# Patient Record
Sex: Female | Born: 1975 | Race: Black or African American | Hispanic: No | Marital: Single | Smoking: Never smoker
Health system: Southern US, Community
[De-identification: ages and names within clinical notes are randomized; demographics above are authoritative.]

## PROBLEM LIST (undated history)

## (undated) DIAGNOSIS — N189 Chronic kidney disease, unspecified: Secondary | ICD-10-CM

## (undated) DIAGNOSIS — I10 Essential (primary) hypertension: Secondary | ICD-10-CM

## (undated) DIAGNOSIS — R51 Headache: Secondary | ICD-10-CM

## (undated) DIAGNOSIS — O09529 Supervision of elderly multigravida, unspecified trimester: Secondary | ICD-10-CM

## (undated) DIAGNOSIS — D219 Benign neoplasm of connective and other soft tissue, unspecified: Secondary | ICD-10-CM

## (undated) HISTORY — DX: Benign neoplasm of connective and other soft tissue, unspecified: D21.9

## (undated) HISTORY — PX: WISDOM TOOTH EXTRACTION: SHX21

## (undated) HISTORY — PX: LEEP: SHX91

## (undated) HISTORY — DX: Chronic kidney disease, unspecified: N18.9

## (undated) HISTORY — DX: Supervision of elderly multigravida, unspecified trimester: O09.529

## (undated) HISTORY — PX: NO PAST SURGERIES: SHX2092

---

## 2003-09-05 ENCOUNTER — Other Ambulatory Visit: Admission: RE | Admit: 2003-09-05 | Discharge: 2003-09-05 | Payer: Self-pay | Admitting: Obstetrics and Gynecology

## 2004-01-02 ENCOUNTER — Inpatient Hospital Stay (HOSPITAL_COMMUNITY): Admission: AD | Admit: 2004-01-02 | Discharge: 2004-01-02 | Payer: Self-pay | Admitting: Obstetrics and Gynecology

## 2004-01-22 ENCOUNTER — Inpatient Hospital Stay (HOSPITAL_COMMUNITY): Admission: AD | Admit: 2004-01-22 | Discharge: 2004-01-22 | Payer: Self-pay | Admitting: Obstetrics and Gynecology

## 2004-02-16 ENCOUNTER — Inpatient Hospital Stay (HOSPITAL_COMMUNITY): Admission: AD | Admit: 2004-02-16 | Discharge: 2004-02-19 | Payer: Self-pay | Admitting: Obstetrics and Gynecology

## 2004-03-22 ENCOUNTER — Other Ambulatory Visit: Admission: RE | Admit: 2004-03-22 | Discharge: 2004-03-22 | Payer: Self-pay | Admitting: Obstetrics and Gynecology

## 2006-11-09 ENCOUNTER — Other Ambulatory Visit: Admission: RE | Admit: 2006-11-09 | Discharge: 2006-11-09 | Payer: Self-pay | Admitting: Family Medicine

## 2008-06-21 ENCOUNTER — Emergency Department (HOSPITAL_COMMUNITY): Admission: EM | Admit: 2008-06-21 | Discharge: 2008-06-21 | Payer: Self-pay | Admitting: Emergency Medicine

## 2008-08-14 ENCOUNTER — Other Ambulatory Visit: Admission: RE | Admit: 2008-08-14 | Discharge: 2008-08-14 | Payer: Self-pay | Admitting: Family Medicine

## 2009-02-10 ENCOUNTER — Other Ambulatory Visit: Admission: RE | Admit: 2009-02-10 | Discharge: 2009-02-10 | Payer: Self-pay | Admitting: Obstetrics and Gynecology

## 2010-01-08 ENCOUNTER — Encounter: Admission: RE | Admit: 2010-01-08 | Discharge: 2010-01-08 | Payer: Self-pay | Admitting: Family Medicine

## 2010-08-17 ENCOUNTER — Other Ambulatory Visit: Admission: RE | Admit: 2010-08-17 | Discharge: 2010-08-17 | Payer: Self-pay | Admitting: Family Medicine

## 2010-08-20 ENCOUNTER — Encounter: Admission: RE | Admit: 2010-08-20 | Discharge: 2010-08-20 | Payer: Self-pay | Admitting: Family Medicine

## 2011-04-15 NOTE — Consult Note (Signed)
NAME:  [name], CID                        ACCOUNT NO.:  [id number]   MEDICAL RECORD NO.:  [id number]                   PATIENT TYPE:  MAT   LOCATION:  [facility]                                 FACILITY:  [facility]   PHYSICIAN:  Lenoard Aden, M.D.             DATE OF BIRTH:  [birth year]   DATE OF CONSULTATION:  01/02/2004  DATE OF DISCHARGE:                                   CONSULTATION   CHIEF COMPLAINT:  Status post minor trauma.   HISTORY OF PRESENT ILLNESS:  The patient is a 35 year old African American  female, G2, P1, at 32+ weeks with a fall on the ice this morning.  She  reports some mild buttocks pain.  No gush of fluid and no bleeding.   PAST MEDICAL HISTORY:  Remarkable for:  1. Hypertension.  2. History of vaginal delivery.   ALLERGIES:  SHELLFISH.   MEDICATIONS:  Labetalol and prenatal vitamins.   FAMILY HISTORY:  Heart disease and hypertension.   PRENATAL LABORATORY DATA:  Blood type AB positive.  Rubella immune.  Hepatitis and HIV negative.   PHYSICAL EXAMINATION:  GENERAL APPEARANCE:  She is a well-developed, well-  nourished, African American female in no acute distress.  HEENT:  Normal.  LUNGS:  Clear.  HEART:  Regular rate and rhythm.  ABDOMEN:  Soft, gravid, and nontender.  PELVIC:  Cervical exam deferred.  EXTREMITIES:  No cords.  NEUROLOGIC:  Nonfocal.   IMPRESSION:  1. Minor trauma at 32 weeks.  2. Chronic hypertension, stable.   PLAN:  Discharge home.  Reassurance given.  Reactive NST noted.  No active  contractions appreciated.  The patient acknowledges and will follow up with  routine appointments as scheduled.                                               Lenoard Aden, M.D.    RJT/MEDQ  D:  01/02/2004  T:  01/02/2004  Job:  [id number]

## 2011-04-15 NOTE — H&P (Signed)
[name], [name]                   ACCOUNT NO.:  [id number]   MEDICAL RECORD NO.:  [id number]                   PATIENT TYPE:   LOCATION:                                       FACILITY:   PHYSICIAN:  Richardean Sale, M.D.                DATE OF BIRTH:  Oct 20, 1976   DATE OF ADMISSION:  02/16/2004  DATE OF DISCHARGE:                                HISTORY & PHYSICAL   ADMISSION DIAGNOSES:  39-week pregnancy with chronic hypertension.   HISTORY OF PRESENT ILLNESS:  This is a 35 year old gravida 2, para 1-0-0-1  black female with a history of hypertension who has been treated with  Labetalol throughout her pregnancy.  The patient presented today for a non-  stress test and was found to have a reactive non-stress test, however, her  blood pressure was elevated at 150/100.  Repeat was 150/102.  Blood  pressures in the office previously had been 130's-140's/80's.  The patient  has been on Labetalol 100 mg p.o. t.i.d.  She denies headaches, visual  changes or epigastric pain.  She reports good fetal movement.  Denies loss  of fluid or vaginal bleeding.  She does report occasional contractions.  A  baseline 24 hour urine and pre-eclampsia labs at 20 weeks were within normal  limits.   OBSTETRICAL HISTORY:  Her past obstetric history includes spontaneous  vaginal delivery X1, weight 7 pounds, 4 ounces. Hypertension developed in  the end of her pregnancy.  Gynecologic history:  Denies sexually transmitted  infections or abnormal Pap smears.   PAST MEDICAL HISTORY:  Chronic hypertension.   PAST SURGICAL HISTORY:  None.   FAMILY HISTORY:  No congenital abnormalities, chromosomal defects, trisomy,  spina bifida, sickle cell disease, cystic fibrosis or other congenital  problems.   SOCIAL HISTORY:  No tobacco, alcohol or drugs.  Patient is married.   PHYSICAL EXAMINATION:  VITAL SIGNS:  Blood pressure 150/100.  Weight 269  pounds.  GENERAL:  She is a well-developed,  well-nourished black female in no  apparent distress.  HEART:  Regular rate and rhythm.  LUNGS:  Clear to auscultation bilaterally.  ABDOMEN:  Gravid, soft, nontender.  Fundal height has been persistently 2 cm  greater than estimated gestational age.  PELVIC:  Cervix previously was 1 cm dilated, 2 cm long, minus 2 station,  vertex and ballottable.  EXTREMITIES:  Trace to 1+ edema bilaterally.  Nontender.  Deep tendon  reflexes 2+ bilaterally. No clonus.   LABORATORY DATA:  Prenatal labs showing blood type AB positive, antibody  screen negative, sickle cell trait negative, RPR nonreactive, rubella  immune, hepatitis B surface antigen nonreactive, human immunodeficiency  virus nonreactive, group B Beta Strep negative at 36 weeks.  One hour  Glucola within normal limits.  Her 28 week hemoglobin was 11.3.  GC and  Chlamydia negative.  Pap smear within normal limits.  Last ultrasound  performed on February 02, 2004 at [redacted] weeks gestation revealed an appropriately  growing fetus measuring 3,308 grams.  Amniotic fluid index was 12.9, vertex  presentation.   ASSESSMENT:  Patient is a 35 year old gravida 2, para 1-0-0-1 black female  at 55 weeks with chronic hypertension now with elevated blood pressure.   PLAN:  1. Admit to labor and delivery.  2. Check pre-eclampsia labs.  3. Continue with fetal monitoring.  4. Start low dose Pitocin, amniotomy when appropriate.  5. Stadol epidural PRN.  6. Serial blood pressures.  7. Expect spontaneous vaginal delivery.                                               Richardean Sale, M.D.    JW/MEDQ  D:  02/16/2004  T:  02/16/2004  Job:  [id number]

## 2012-09-16 ENCOUNTER — Encounter (HOSPITAL_COMMUNITY): Payer: Self-pay | Admitting: Emergency Medicine

## 2012-09-16 ENCOUNTER — Emergency Department (HOSPITAL_COMMUNITY)
Admission: EM | Admit: 2012-09-16 | Discharge: 2012-09-17 | Disposition: A | Discharge status: Home / Self Care | Payer: Managed Care, Other (non HMO) | Attending: Emergency Medicine | Admitting: Emergency Medicine

## 2012-09-16 ENCOUNTER — Emergency Department (HOSPITAL_COMMUNITY)
Admission: EM | Admit: 2012-09-16 | Discharge: 2012-09-16 | Disposition: A | Discharge status: Nursing Home (Includes ICF) | Payer: Self-pay | Source: Home / Self Care

## 2012-09-16 DIAGNOSIS — R040 Epistaxis: Secondary | ICD-10-CM | POA: Insufficient documentation

## 2012-09-16 DIAGNOSIS — R5381 Other malaise: Secondary | ICD-10-CM | POA: Insufficient documentation

## 2012-09-16 DIAGNOSIS — I10 Essential (primary) hypertension: Secondary | ICD-10-CM

## 2012-09-16 HISTORY — DX: Essential (primary) hypertension: I10

## 2012-09-16 MED ORDER — METOPROLOL TARTRATE 25 MG PO TABS
25.0000 mg | ORAL_TABLET | Freq: Once | ORAL | Status: AC
  Administered 2012-09-16: 25 mg via ORAL
  Filled 2012-09-16: qty 1

## 2012-09-16 MED ORDER — CLONIDINE HCL 0.1 MG PO TABS
0.2000 mg | ORAL_TABLET | Freq: Once | ORAL | Status: AC
  Administered 2012-09-16: 0.2 mg via ORAL
  Filled 2012-09-16: qty 2

## 2012-09-16 NOTE — ED Notes (Signed)
Dressing applied to nose. 

## 2012-09-16 NOTE — ED Provider Notes (Signed)
History     CSN: [id number]  Arrival date & time 09/16/12  1953   First MD Initiated Contact with Patient 09/16/12 2031      Chief Complaint  Patient presents with  . Epistaxis   HPI  History provided by the patient. Patient is a 36 year old female with history of difficult to control hypertension who presents with complaints of right nosebleed. Patient reports that bleeding began around 5 or 6:00. Patient was initially seen at urgent care Center and sent here for additional treatment. Patient states they were unable to stop the bleeding in urgent care Center. Patient also has had elevated blood pressures today. She reports difficulty controlling her blood pressure in the past but recently was put on a clonidine and additional blood pressure medications which seemed to improve her pressure. Patient denies any other symptoms currently. Denies any headache, dizziness, lightheadedness, chest pain, shortness of breath, abdominal pain or urinary complaints. Patient is not on any blood thinner medicines. She denies having similar nosebleeds in the past. She denies any injury or trauma to the nose.    Past Medical History  Diagnosis Date  . Hypertension     No past surgical history on file.  No family history on file.  History  Substance Use Topics  . Smoking status: Never Smoker   . Smokeless tobacco: Not on file  . Alcohol Use: No    OB History    Grav Para Term Preterm Abortions TAB SAB Ect Mult Living                  Review of Systems  Constitutional: Positive for fatigue. Negative for fever and chills.  HENT: Positive for nosebleeds. Negative for congestion, sore throat and rhinorrhea.   Respiratory: Negative for shortness of breath.   Cardiovascular: Negative for chest pain.  Gastrointestinal: Negative for nausea, vomiting and abdominal pain.  Neurological: Negative for dizziness, syncope, weakness, light-headedness, numbness and headaches.    Allergies  Review of  patient's allergies indicates no known allergies.  Home Medications   Current Outpatient Rx  Name Route Sig Dispense Refill  . CLONIDINE HCL 0.2 MG PO TABS Oral Take 0.2 mg by mouth 2 (two) times daily.    Marland Kitchen OLMESARTAN-AMLODIPINE-HCTZ 40-10-25 MG PO TABS Oral Take 1 tablet by mouth daily.      BP 194/125  Pulse 71  Temp 97.9 F (36.6 C) (Oral)  Resp 20  SpO2 98%  LMP 09/01/2012  Physical Exam  Nursing note and vitals reviewed. Constitutional: She is oriented to person, place, and time. She appears well-developed and well-nourished. No distress.  HENT:  Head: Normocephalic.  Mouth/Throat: Oropharynx is clear and moist.       Bleeding from the posterior right nostril.  Eyes: Pupils are equal, round, and reactive to light.  Neck: Normal range of motion. Neck supple.  Cardiovascular: Normal rate and regular rhythm.   No murmur heard. Pulmonary/Chest: Effort normal and breath sounds normal. No respiratory distress. She has no wheezes. She has no rales.  Abdominal: Soft.  Neurological: She is alert and oriented to person, place, and time.  Skin: Skin is warm and dry.  Psychiatric: She has a normal mood and affect. Her behavior is normal.    ED Course  EPISTAXIS MANAGEMENT Date/Time: 09/16/2012 11:30 PM Performed by: Angus Seller Authorized by: Angus Seller Consent: Verbal consent obtained. Risks and benefits: risks, benefits and alternatives were discussed Consent given by: patient Patient identity confirmed: verbally with patient Time out:  Immediately prior to procedure a "time out" was called to verify the correct patient, procedure, equipment, support staff and site/side marked as required. Treatment site: right posterior Repair method: nasal balloon Post-procedure assessment: bleeding decreased Treatment complexity: complex Patient tolerance: Patient tolerated the procedure well with no immediate complications.     Results for orders placed during the  hospital encounter of 09/16/12  POCT I-STAT, CHEM 8      Component Value Range   Sodium 139  135 - 145 mEq/L   Potassium 3.9  3.5 - 5.1 mEq/L   Chloride 102  96 - 112 mEq/L   BUN 30 (*) 6 - 23 mg/dL   Creatinine, Ser 1.61 (*) 0.50 - 1.10 mg/dL   Glucose, Bld 096 (*) 70 - 99 mg/dL   Calcium, Ion 0.45  4.09 - 1.23 mmol/L   TCO2 23  0 - 100 mmol/L   Hemoglobin 14.3  12.0 - 15.0 g/dL   HCT 81.1  91.4 - 78.2 %     1. Right-sided epistaxis   2. Hypertension       MDM  Patient seen and evaluated. Patient currently with packing in nose without any continued bleeding through the packing. Patient appears well in no acute distress. Patient is hypertensive.   Medications given for her hypertension. Patient having some improvement.  Blood pressure continues to improve. Pressure now 173/116. Still hypertensive but asymptomatic. Rhino Rocket was placed in right nostril. Patient currently having control bleeding. She does occasionally have some small amounts of blood in the back of the throat. At this time patient requesting to return home.      Angus Seller, [place] 09/17/12 [phone]

## 2012-09-16 NOTE — ED Notes (Signed)
Patient has had a nose bleed approx 6:00 p.m.   This is her first nose bleed.  Patient reports having a very bad headache, nausea last night.  Took excedrin migraine last night, woke today feeling great.  Reports feeling fine today, even when nose started bleeding today

## 2012-09-16 NOTE — ED Provider Notes (Signed)
History     CSN: [id number]  Arrival date & time 09/16/12  1847   None     Chief Complaint  Patient presents with  . Epistaxis    (Consider location/radiation/quality/duration/timing/severity/associated sxs/prior treatment) HPI Comments: 36 year old female with a history of hypertension presents with acute epistaxis it started approximately 1.5 hours ago. Bleeding has been persistent and moderate. She is vomiting up blood. Bleeding is primarily from the right nostril at this time she has had some bleeding from the left nostril. She denies headache chest pain or shortness of breath, however, last night she had a severe headache.   Past Medical History  Diagnosis Date  . Hypertension     History reviewed. No pertinent past surgical history.  No family history on file.  History  Substance Use Topics  . Smoking status: Never Smoker   . Smokeless tobacco: Not on file  . Alcohol Use: No    OB History    Grav Para Term Preterm Abortions TAB SAB Ect Mult Living                  Review of Systems  Constitutional: Negative for fever and activity change.  HENT: Positive for nosebleeds. Negative for congestion, sore throat, facial swelling, trouble swallowing and neck pain.   Respiratory: Negative.   Gastrointestinal: Positive for nausea and vomiting.  Neurological: Negative.     Allergies  Review of patient's allergies indicates not on file.  Home Medications   Current Outpatient Rx  Name Route Sig Dispense Refill  . CLONIDINE HCL PO Oral Take by mouth 2 (two) times daily.    Marya Landry PO Oral Take by mouth.      BP 231/156  Pulse 72  Temp 98.6 F (37 C) (Oral)  Resp 19  SpO2 96%  LMP 09/01/2012  Physical Exam  Constitutional: She is oriented to person, place, and time. She appears well-developed and well-nourished. She appears distressed.  HENT:       Continuous trickle of blood from the right nostril. There is a blood clot in the left nostril.  Eyes:  EOM are normal.  Neck: Neck supple.  Cardiovascular: Normal rate.   Pulmonary/Chest: Effort normal. No respiratory distress.  Musculoskeletal: Normal range of motion.  Neurological: She is alert and oriented to person, place, and time.  Skin: Skin is warm and dry. No erythema.    ED Course  EPISTAXIS MANAGEMENT Date/Time: 09/16/2012 7:27 PM Performed by: Phineas Real, Lorianne Malbrough Authorized by: Phineas Real, Lucina Betty Consent: Verbal consent obtained. Consent given by: patient Patient identity confirmed: verbally with patient Patient sedated: no Repair method: anterior pack Treatment complexity: simple Comments: Rhinorocket placed in R nostril.   (including critical care time)  Labs Reviewed - No data to display No results found.   1. Epistaxis   2. Hypertension, malignant       MDM  Rhino Rocket placed into the right nostril. She will be sent down to the emergency department via shuttle for epistaxis management and hypertension management.         Hayden Rasmussen, NP 09/16/12 1929  Hayden Rasmussen, NP 09/16/12 [phone]

## 2012-09-16 NOTE — ED Notes (Signed)
Pt has been bleeding since 1730

## 2012-09-16 NOTE — ED Notes (Addendum)
Pt states epistais since 16:00 this afternoon during a meeting. Pt denies HA, pain or pressure. Pt just states started bleeding. Pt went to urgent care and they inserted a rhinorytis into right nare. Pt bleeding around the rhino which is partially coming out. Pt alert and oriented. denies pain currently. Pt denies blood thinners, but does have elevated BP. Did take BP meds today.

## 2012-09-16 NOTE — ED Notes (Signed)
Family at bedside. 

## 2012-09-16 NOTE — ED Provider Notes (Signed)
Medical screening examination/treatment/procedure(s) were performed by non-physician practitioner and as supervising physician I was immediately available for consultation/collaboration.  Kameshia Madruga, M.D.   Daniel Johndrow C Shawntelle Ungar, MD 09/16/12 2143 

## 2012-09-17 LAB — POCT I-STAT, CHEM 8
Chloride: 102 mEq/L (ref 96–112)
Creatinine, Ser: 1.5 mg/dL — ABNORMAL HIGH (ref 0.50–1.10)
Glucose, Bld: 104 mg/dL — ABNORMAL HIGH (ref 70–99)
HCT: 42 % (ref 36.0–46.0)
Hemoglobin: 14.3 g/dL (ref 12.0–15.0)
Potassium: 3.9 mEq/L (ref 3.5–5.1)
Sodium: 139 mEq/L (ref 135–145)

## 2012-09-17 MED ORDER — LABETALOL HCL 5 MG/ML IV SOLN
20.0000 mg | Freq: Once | INTRAVENOUS | Status: AC
  Administered 2012-09-17: 20 mg via INTRAVENOUS
  Filled 2012-09-17: qty 4

## 2012-09-17 MED ORDER — SODIUM CHLORIDE 0.9 % IV BOLUS (SEPSIS)
1000.0000 mL | Freq: Once | INTRAVENOUS | Status: AC
  Administered 2012-09-17: 1000 mL via INTRAVENOUS

## 2012-09-17 NOTE — ED Provider Notes (Signed)
Medical screening examination/treatment/procedure(s) were performed by non-physician practitioner and as supervising physician I was immediately available for consultation/collaboration.  Izumi Mixon M Kathe Wirick, MD 09/17/12 0512 

## 2014-02-17 ENCOUNTER — Inpatient Hospital Stay (HOSPITAL_COMMUNITY)
Admission: EM | Admit: 2014-02-17 | Discharge: 2014-02-19 | DRG: 683 | Disposition: A | Discharge status: Home / Self Care | Payer: 59 | Attending: Internal Medicine | Admitting: Internal Medicine

## 2014-02-17 ENCOUNTER — Encounter (HOSPITAL_COMMUNITY): Payer: Self-pay | Admitting: Emergency Medicine

## 2014-02-17 DIAGNOSIS — Z8249 Family history of ischemic heart disease and other diseases of the circulatory system: Secondary | ICD-10-CM

## 2014-02-17 DIAGNOSIS — E860 Dehydration: Secondary | ICD-10-CM | POA: Diagnosis present

## 2014-02-17 DIAGNOSIS — N189 Chronic kidney disease, unspecified: Secondary | ICD-10-CM

## 2014-02-17 DIAGNOSIS — N183 Chronic kidney disease, stage 3 unspecified: Secondary | ICD-10-CM | POA: Diagnosis present

## 2014-02-17 DIAGNOSIS — Z833 Family history of diabetes mellitus: Secondary | ICD-10-CM

## 2014-02-17 DIAGNOSIS — N179 Acute kidney failure, unspecified: Secondary | ICD-10-CM | POA: Diagnosis present

## 2014-02-17 DIAGNOSIS — A088 Other specified intestinal infections: Secondary | ICD-10-CM | POA: Diagnosis present

## 2014-02-17 DIAGNOSIS — I10 Essential (primary) hypertension: Secondary | ICD-10-CM | POA: Diagnosis present

## 2014-02-17 DIAGNOSIS — G43909 Migraine, unspecified, not intractable, without status migrainosus: Secondary | ICD-10-CM | POA: Diagnosis present

## 2014-02-17 DIAGNOSIS — Z79899 Other long term (current) drug therapy: Secondary | ICD-10-CM

## 2014-02-17 DIAGNOSIS — K529 Noninfective gastroenteritis and colitis, unspecified: Secondary | ICD-10-CM

## 2014-02-17 DIAGNOSIS — I129 Hypertensive chronic kidney disease with stage 1 through stage 4 chronic kidney disease, or unspecified chronic kidney disease: Principal | ICD-10-CM | POA: Diagnosis present

## 2014-02-17 DIAGNOSIS — R9431 Abnormal electrocardiogram [ECG] [EKG]: Secondary | ICD-10-CM

## 2014-02-17 DIAGNOSIS — I160 Hypertensive urgency: Secondary | ICD-10-CM | POA: Diagnosis present

## 2014-02-17 DIAGNOSIS — J309 Allergic rhinitis, unspecified: Secondary | ICD-10-CM | POA: Diagnosis present

## 2014-02-17 DIAGNOSIS — R748 Abnormal levels of other serum enzymes: Secondary | ICD-10-CM

## 2014-02-17 HISTORY — DX: Headache: R51

## 2014-02-17 LAB — I-STAT TROPONIN, ED: TROPONIN I, POC: 0.04 ng/mL (ref 0.00–0.08)

## 2014-02-17 LAB — BASIC METABOLIC PANEL
BUN: 28 mg/dL — ABNORMAL HIGH (ref 6–23)
CALCIUM: 9.7 mg/dL (ref 8.4–10.5)
CHLORIDE: 98 meq/L (ref 96–112)
CO2: 26 meq/L (ref 19–32)
Creatinine, Ser: 2.28 mg/dL — ABNORMAL HIGH (ref 0.50–1.10)
GFR calc Af Amer: 30 mL/min — ABNORMAL LOW (ref >90)
GFR calc non Af Amer: 26 mL/min — ABNORMAL LOW (ref >90)
GLUCOSE: 104 mg/dL — AB (ref 70–99)
Potassium: 4.7 mEq/L (ref 3.7–5.3)
SODIUM: 136 meq/L — AB (ref 137–147)

## 2014-02-17 LAB — CBC WITH DIFFERENTIAL/PLATELET
Basophils Absolute: 0 10*3/uL (ref 0.0–0.1)
Basophils Relative: 1 % (ref 0–1)
Eosinophils Absolute: 0.2 10*3/uL (ref 0.0–0.7)
Eosinophils Relative: 3 % (ref 0–5)
HCT: 40 % (ref 36.0–46.0)
Hemoglobin: 13.8 g/dL (ref 12.0–15.0)
LYMPHS PCT: 33 % (ref 12–46)
Lymphs Abs: 2.2 10*3/uL (ref 0.7–4.0)
MCH: 29.2 pg (ref 26.0–34.0)
MCHC: 34.5 g/dL (ref 30.0–36.0)
MCV: 84.6 fL (ref 78.0–100.0)
Monocytes Absolute: 0.6 10*3/uL (ref 0.1–1.0)
Monocytes Relative: 8 % (ref 3–12)
Neutro Abs: 3.7 10*3/uL (ref 1.7–7.7)
Neutrophils Relative %: 56 % (ref 43–77)
PLATELETS: 227 10*3/uL (ref 150–400)
RBC: 4.73 MIL/uL (ref 3.87–5.11)
RDW: 14.1 % (ref 11.5–15.5)
WBC: 6.8 10*3/uL (ref 4.0–10.5)

## 2014-02-17 LAB — COMPREHENSIVE METABOLIC PANEL
ALT: 17 U/L (ref 0–35)
AST: 13 U/L (ref 0–37)
Albumin: 3.8 g/dL (ref 3.5–5.2)
Alkaline Phosphatase: 59 U/L (ref 39–117)
BILIRUBIN TOTAL: 0.6 mg/dL (ref 0.3–1.2)
BUN: 30 mg/dL — ABNORMAL HIGH (ref 6–23)
CO2: 26 meq/L (ref 19–32)
CREATININE: 2.43 mg/dL — AB (ref 0.50–1.10)
Calcium: 10.2 mg/dL (ref 8.4–10.5)
Chloride: 98 mEq/L (ref 96–112)
GFR calc non Af Amer: 24 mL/min — ABNORMAL LOW (ref >90)
GFR, EST AFRICAN AMERICAN: 28 mL/min — AB (ref >90)
GLUCOSE: 95 mg/dL (ref 70–99)
Potassium: 4.3 mEq/L (ref 3.7–5.3)
Sodium: 137 mEq/L (ref 137–147)
Total Protein: 7.5 g/dL (ref 6.0–8.3)

## 2014-02-17 LAB — CBC
HCT: 38.7 % (ref 36.0–46.0)
Hemoglobin: 13.4 g/dL (ref 12.0–15.0)
MCH: 29.5 pg (ref 26.0–34.0)
MCHC: 34.6 g/dL (ref 30.0–36.0)
MCV: 85.1 fL (ref 78.0–100.0)
PLATELETS: 203 10*3/uL (ref 150–400)
RBC: 4.55 MIL/uL (ref 3.87–5.11)
RDW: 14.2 % (ref 11.5–15.5)
WBC: 6 10*3/uL (ref 4.0–10.5)

## 2014-02-17 LAB — RAPID URINE DRUG SCREEN, HOSP PERFORMED
AMPHETAMINES: NOT DETECTED
Barbiturates: POSITIVE — AB
Benzodiazepines: NOT DETECTED
COCAINE: NOT DETECTED
OPIATES: NOT DETECTED
TETRAHYDROCANNABINOL: NOT DETECTED

## 2014-02-17 LAB — PROTIME-INR
INR: 1 (ref 0.00–1.49)
Prothrombin Time: 13 seconds (ref 11.6–15.2)

## 2014-02-17 LAB — I-STAT CHEM 8, ED
BUN: 27 mg/dL — ABNORMAL HIGH (ref 6–23)
CALCIUM ION: 1.22 mmol/L (ref 1.12–1.23)
CHLORIDE: 100 meq/L (ref 96–112)
Creatinine, Ser: 2.5 mg/dL — ABNORMAL HIGH (ref 0.50–1.10)
Glucose, Bld: 100 mg/dL — ABNORMAL HIGH (ref 70–99)
HEMATOCRIT: 41 % (ref 36.0–46.0)
Hemoglobin: 13.9 g/dL (ref 12.0–15.0)
Potassium: 4.7 mEq/L (ref 3.7–5.3)
Sodium: 137 mEq/L (ref 137–147)
TCO2: 25 mmol/L (ref 0–100)

## 2014-02-17 LAB — APTT: APTT: 27 s (ref 24–37)

## 2014-02-17 LAB — PHOSPHORUS: PHOSPHORUS: 4.1 mg/dL (ref 2.3–4.6)

## 2014-02-17 LAB — MAGNESIUM: Magnesium: 2.1 mg/dL (ref 1.5–2.5)

## 2014-02-17 MED ORDER — ASPIRIN-ACETAMINOPHEN-CAFFEINE 250-250-65 MG PO TABS
2.0000 | ORAL_TABLET | Freq: Four times a day (QID) | ORAL | Status: DC | PRN
  Administered 2014-02-18 – 2014-02-19 (×4): 2 via ORAL
  Filled 2014-02-17 (×3): qty 2

## 2014-02-17 MED ORDER — CLONIDINE HCL 0.2 MG PO TABS
0.2000 mg | ORAL_TABLET | Freq: Two times a day (BID) | ORAL | Status: DC
  Administered 2014-02-17 – 2014-02-18 (×3): 0.2 mg via ORAL
  Filled 2014-02-17 (×5): qty 1

## 2014-02-17 MED ORDER — SODIUM CHLORIDE 0.9 % IV SOLN
INTRAVENOUS | Status: DC
  Administered 2014-02-17: 22:00:00 via INTRAVENOUS

## 2014-02-17 MED ORDER — ACETAMINOPHEN 650 MG RE SUPP: 650.0000 mg | Freq: Four times a day (QID) | RECTAL | Status: DC | PRN

## 2014-02-17 MED ORDER — HYDRALAZINE HCL 20 MG/ML IJ SOLN
10.0000 mg | Freq: Once | INTRAMUSCULAR | Status: AC
  Administered 2014-02-17: 10 mg via INTRAVENOUS
  Filled 2014-02-17: qty 0.5

## 2014-02-17 MED ORDER — HYDROCODONE-ACETAMINOPHEN 5-325 MG PO TABS
1.0000 | ORAL_TABLET | ORAL | Status: DC | PRN
  Administered 2014-02-18: 2 via ORAL
  Filled 2014-02-17: qty 2

## 2014-02-17 MED ORDER — ONDANSETRON HCL 4 MG PO TABS
4.0000 mg | ORAL_TABLET | Freq: Four times a day (QID) | ORAL | Status: DC | PRN
  Administered 2014-02-19: 4 mg via ORAL
  Filled 2014-02-17: qty 1

## 2014-02-17 MED ORDER — ACETAMINOPHEN 325 MG PO TABS: 650.0000 mg | ORAL_TABLET | Freq: Four times a day (QID) | ORAL | Status: DC | PRN

## 2014-02-17 MED ORDER — HYDROCHLOROTHIAZIDE 25 MG PO TABS
25.0000 mg | ORAL_TABLET | Freq: Once | ORAL | Status: AC
Start: 2014-02-17 — End: 2014-02-17
  Administered 2014-02-17: 25 mg via ORAL
  Filled 2014-02-17: qty 1

## 2014-02-17 MED ORDER — ONDANSETRON HCL 4 MG/2ML IJ SOLN
4.0000 mg | Freq: Four times a day (QID) | INTRAMUSCULAR | Status: DC | PRN
  Administered 2014-02-18: 4 mg via INTRAVENOUS

## 2014-02-17 MED ORDER — HYDRALAZINE HCL 20 MG/ML IJ SOLN
10.0000 mg | Freq: Four times a day (QID) | INTRAMUSCULAR | Status: DC
Start: 2014-02-17 — End: 2014-02-18
  Administered 2014-02-17 – 2014-02-18 (×3): 10 mg via INTRAVENOUS
  Filled 2014-02-17 (×7): qty 0.5

## 2014-02-17 MED ORDER — AMLODIPINE BESYLATE 10 MG PO TABS
10.0000 mg | ORAL_TABLET | Freq: Once | ORAL | Status: AC
  Administered 2014-02-17: 10 mg via ORAL
  Filled 2014-02-17: qty 1

## 2014-02-17 MED ORDER — ONDANSETRON HCL 4 MG/2ML IJ SOLN
4.0000 mg | Freq: Three times a day (TID) | INTRAMUSCULAR | Status: AC | PRN
  Filled 2014-02-17: qty 2

## 2014-02-17 MED ORDER — OXYMETAZOLINE HCL 0.05 % NA SOLN: 1.0000 | Freq: Every day | NASAL | Status: DC | PRN

## 2014-02-17 MED ORDER — IRBESARTAN 300 MG PO TABS
300.0000 mg | ORAL_TABLET | Freq: Every day | ORAL | Status: DC
  Administered 2014-02-17: 300 mg via ORAL
  Filled 2014-02-17: qty 1

## 2014-02-17 MED ORDER — ADULT MULTIVITAMIN W/MINERALS CH
1.0000 | ORAL_TABLET | Freq: Every day | ORAL | Status: DC
  Administered 2014-02-17 – 2014-02-19 (×3): 1 via ORAL
  Filled 2014-02-17 (×3): qty 1

## 2014-02-17 NOTE — ED Provider Notes (Signed)
Medical screening examination/treatment/procedure(s) were performed by non-physician practitioner and as supervising physician I was immediately available for consultation/collaboration.   EKG Interpretation   Date/Time:  Monday February 17 2014 12:47:01 EDT Ventricular Rate:  53 PR Interval:  157 QRS Duration: 94 QT Interval:  487 QTC Calculation: 457 R Axis:   8 Text Interpretation:  Sinus rhythm Probable left atrial enlargement  Abnormal T, consider ischemia, lateral leads Confirmed by Alvino Chapel  MD,  Marcheta Horsey [phone]) on 02/17/2014 2:02:24 PM       Jasper Riling. Alvino Chapel, MD 02/17/14 [phone]

## 2014-02-17 NOTE — ED Provider Notes (Signed)
CSN: [id number]     Arrival date & time 02/17/14  1212 History   First MD Initiated Contact with Patient 02/17/14 1338     Chief Complaint  Patient presents with  . Hypertension     (Consider location/radiation/quality/duration/timing/severity/associated sxs/prior Treatment) HPI [name] is a 38 y.o. female who presents to emergency department complaining of elevated blood pressure. Patient states she has had sinus congestion and sinus pressure for several days. Also has had nausea, vomiting, diarrhea for several days. She went to her primary care Dr., who gave her a "pain shot" and two clonidine tablets which patient normally takes every morning, and she was sent here to emergency department for further evaluation of her elevated blood pressure. Her blood pressure at her doctor's office was 204/70. She states she has been taking multiple over-the-counter medications recently for her sinus congestion, which she believes elevated her blood pressure. She states she has had elevated blood pressure for about 3 days now. She denies any headache, chest pain, shortness of breath. No swelling in extremities. She states her primary care doctor did prescribe her medication for sinus infection, but told that she may need to stop the emergency department to make sure everything else was okay. She currently denies any symptoms. She states that after getting a pain shot her pain completely disappeared. Patient admits to not taking any of her blood pressure medicines this morning because she has been nauseated and vomiting. Patient denies feeling nauseated at this time. She states she has not vomited today.  Past Medical History  Diagnosis Date  . Hypertension    History reviewed. No pertinent past surgical history. No family history on file. History  Substance Use Topics  . Smoking status: Never Smoker   . Smokeless tobacco: Not on file  . Alcohol Use: No   OB History   Grav Para Term Preterm  Abortions TAB SAB Ect Mult Living                 Review of Systems  Constitutional: Negative for fever and chills.  HENT: Positive for congestion and sinus pressure.   Respiratory: Negative for cough, chest tightness and shortness of breath.   Cardiovascular: Negative for chest pain, palpitations and leg swelling.  Gastrointestinal: Positive for nausea, vomiting and diarrhea. Negative for abdominal pain.  Genitourinary: Negative for dysuria and flank pain.  Musculoskeletal: Negative for arthralgias, myalgias, neck pain and neck stiffness.  Skin: Negative for rash.  Neurological: Negative for dizziness, weakness, light-headedness and headaches.  All other systems reviewed and are negative.      Allergies  Review of patient's allergies indicates no known allergies.  Home Medications   Current Outpatient Rx  Name  Route  Sig  Dispense  Refill  . aspirin-acetaminophen-caffeine (EXCEDRIN MIGRAINE) 250-250-65 MG per tablet   Oral   Take 2 tablets by mouth every 6 (six) hours as needed for headache.         . cloNIDine (CATAPRES) 0.2 MG tablet   Oral   Take 0.2 mg by mouth 2 (two) times daily.         . Multiple Vitamin (MULTIVITAMIN WITH MINERALS) TABS tablet   Oral   Take 1 tablet by mouth daily.         . Olmesartan-Amlodipine-HCTZ (TRIBENZOR) 40-10-25 MG TABS   Oral   Take 1 tablet by mouth daily.         Marland Kitchen oxymetazoline (AFRIN) 0.05 % nasal spray   Each Nare  Place 1 spray into both nostrils daily as needed for congestion.          BP 173/133  Pulse 58  Temp(Src) 97.6 F (36.4 C) (Oral)  Resp 18  SpO2 100%  LMP 01/26/2014 Physical Exam  Nursing note and vitals reviewed. Constitutional: She is oriented to person, place, and time. She appears well-developed and well-nourished. No distress.  HENT:  Head: Normocephalic and atraumatic.  Eyes: Conjunctivae and EOM are normal. Pupils are equal, round, and reactive to light.  Neck: Neck supple.   Cardiovascular: Normal rate, regular rhythm and normal heart sounds.   Pulmonary/Chest: Effort normal and breath sounds normal. No respiratory distress. She has no wheezes. She has no rales.  Abdominal: Soft. Bowel sounds are normal. She exhibits no distension. There is no tenderness. There is no rebound.  Musculoskeletal: She exhibits no edema.  Neurological: She is alert and oriented to person, place, and time. No cranial nerve deficit. Coordination normal.  Skin: Skin is warm and dry.  Psychiatric: She has a normal mood and affect. Her behavior is normal.    ED Course  Procedures (including critical care time) Labs Review Labs Reviewed  BASIC METABOLIC PANEL - Abnormal; Notable for the following:    Sodium 136 (*)    Glucose, Bld 104 (*)    BUN 28 (*)    Creatinine, Ser 2.28 (*)    GFR calc non Af Amer 26 (*)    GFR calc Af Amer 30 (*)    All other components within normal limits  I-STAT CHEM 8, ED - Abnormal; Notable for the following:    BUN 27 (*)    Creatinine, Ser 2.50 (*)    Glucose, Bld 100 (*)    All other components within normal limits  CBC  I-STAT TROPOININ, ED   Imaging Review No results found.   EKG Interpretation   Date/Time:  Monday February 17 2014 12:47:01 EDT Ventricular Rate:  53 PR Interval:  157 QRS Duration: 94 QT Interval:  487 QTC Calculation: 457 R Axis:   8 Text Interpretation:  Sinus rhythm Probable left atrial enlargement  Abnormal T, consider ischemia, lateral leads Confirmed by Alvino Chapel  MD,  Ovid Curd [phone]) on 02/17/2014 2:02:24 PM      MDM   Final diagnoses:  Hypertensive urgency  Elevated creatine kinase  Abnormal ECG    Pt with elevated blood pressure, was sent here for further evaluation. Her BP initially here 173/133. She admits to not taking her medications.   2:40 PM ECG shows inverted T waves in lateral leads. No old ECG in the system. I spoke with [place] office. They do not have an old ECG on file there.  Also spoke with [facility] at [place], no old ECG there. Pt's Creatinine elevated  As well compared to last in our system. In setting of pt's elevated BP, abnormal ECG, elevated creatinine, will need inpatient admission for hypertensive emergency.   I have treated pt in ED with her regular tribenzor at this time, and she just receved 0.2mg  of clonidine at her PCP's office. Her blood pressure now is 170s/110s. At this time I think BP needs to be lowers slowly. Pt is currently asymptomatic. Spoke with [facility], will admit.    Filed Vitals:   02/17/14 1400 02/17/14 1430 02/17/14 1446 02/17/14 1500  BP: 171/131 194/142 194/142 173/117  Pulse: 56 47 60 59  Temp:   97.4 F (36.3 C)   TempSrc:   Oral  Resp: 10 15 19 13   SpO2: 100% 96% 100% 98%     Renold Genta, PA-C 02/17/14 1533

## 2014-02-17 NOTE — Progress Notes (Signed)
Received report on patient from ED.  Patient BP still elevated at 173/117.  ED RN to stabilize BP prior to transferring patient to floor.  Await callback from nurse.

## 2014-02-17 NOTE — Progress Notes (Addendum)
Patient received to floor with BP of 157/105.  Dr. Charlies Silvers paged, orders for schedule hydralazine and norvasc received.  Will administer and monitor BP.

## 2014-02-17 NOTE — Progress Notes (Signed)
Patient reports hx of stomach bug for the past week and last episode of diarrhea and vomiting this AM.  Patient has been placed on enteric precautions per protocol for C. Diff/enterovirus r/o.  Dr. Charlies Silvers notified. Have educated patient and family regarding infection prevention precautions.  Will continue to monitor.

## 2014-02-17 NOTE — ED Notes (Addendum)
Gave report to nurse on floor, nurse stated that pt BP was unstable and wanted to know if IV BP medication could be given to lower BP. Called Dr. Charlies Silvers for order and to make her aware.

## 2014-02-17 NOTE — H&P (Addendum)
[facility] History and Physical  [name] FGH:[id number] DOB: 1976/09/22 DOA: 02/17/2014  Referring physician: ER physician PCP: Rachell Cipro, MD   Chief Complaint: high blood pressure  HPI:  38 year old female with past medical history of hypertension who presented to [facility] ED from PCP office due to high blood pressure, specifically 204/70. Pt reported she had ongoing nausea, vomiting and diarrhea for past week she reported she had stomach flu. She took her blood pressure meds but she had vomiting so she stopped taking blood pressure meds. No reports of headaches or blurry vision. No reports of chest pain, shortness of breath or palpitations. No reports of fevers. No blood in stool or urine.  In ED, BP was 194/142 adn with clonidine, avapro, hctz and Norvasc blood pressure went down to 157/105. Her blood work was significant for an acute elevation in creatinine, 2.38. Pt is not aware of any kidney problems in past.   Assessment and Plan:  Principal Problem:   Hypertensive urgency - restarted norvasc, clonidine and held avapro and hctz due to renal insufficiency - added hydralazine 10 mg IV every 6 hours for better BP control Active Problems:   Acute renal failure - likely due to Avapro/Hctz which is not on hold - may use IV fluids for next 12 hours and then reassess if IV fluids needed - recheck renal function in am   Nausea/vomiting/diarrhea - likely viral gastroenteritis - check viral GI pathogen panel, stool for C.diff, stool culture  Radiological Exams on Admission: No results found.   Code Status: Full Family Communication: Pt at bedside Disposition Plan: Admit for further evaluation  Leisa Lenz, MD  [facility] Pager [phone]  Review of Systems:  Constitutional: Negative for fever, chills and malaise/fatigue. Negative for diaphoresis.  HENT: Negative for hearing loss, ear pain, nosebleeds, congestion, sore throat, neck pain, tinnitus and ear  discharge.   Eyes: Negative for blurred vision, double vision, photophobia, pain, discharge and redness.  Respiratory: Negative for cough, hemoptysis, sputum production, shortness of breath, wheezing and stridor.   Cardiovascular: Negative for chest pain, palpitations, orthopnea, claudication and leg swelling.  Gastrointestinal: Negative for nausea, vomiting and abdominal pain. Negative for heartburn, constipation, blood in stool and melena.  Genitourinary: Negative for dysuria, urgency, frequency, hematuria and flank pain.  Musculoskeletal: Negative for myalgias, back pain, joint pain and falls.  Skin: Negative for itching and rash.  Neurological: Negative for dizziness and weakness. Negative for tingling, tremors, sensory change, speech change, focal weakness, loss of consciousness and headaches.  Endo/Heme/Allergies: Negative for environmental allergies and polydipsia. Does not bruise/bleed easily.  Psychiatric/Behavioral: Negative for suicidal ideas. The patient is not nervous/anxious.      Past Medical History  Diagnosis Date  . Hypertension   . OFBPZWCH(852.7)    Past Surgical History  Procedure Laterality Date  . No past surgeries     Social History:  reports that she has never smoked. She has never used smokeless tobacco. She reports that she does not drink alcohol or use illicit drugs.  No Known Allergies  Family History: hypertension in mother and father, diabetes in mother  Prior to Admission medications   Medication Sig Start Date End Date Taking? Authorizing Provider  aspirin-acetaminophen-caffeine (EXCEDRIN MIGRAINE) [phone] MG per tablet Take 2 tablets by mouth every 6 (six) hours as needed for headache.   Yes Historical Provider, MD  cloNIDine (CATAPRES) 0.2 MG tablet Take 0.2 mg by mouth 2 (two) times daily.   Yes Historical Provider, MD  Multiple  Vitamin (MULTIVITAMIN WITH MINERALS) TABS tablet Take 1 tablet by mouth daily.   Yes Historical Provider, MD   Olmesartan-Amlodipine-HCTZ (TRIBENZOR) 40-10-25 MG TABS Take 1 tablet by mouth daily.   Yes Historical Provider, MD  oxymetazoline (AFRIN) 0.05 % nasal spray Place 1 spray into both nostrils daily as needed for congestion.   Yes Historical Provider, MD   Physical Exam: Filed Vitals:   02/17/14 1600 02/17/14 1630 02/17/14 1701 02/17/14 1747  BP: 164/123 163/115  157/105  Pulse: 69 73  70  Temp:   98.3 F (36.8 C) 98.4 F (36.9 C)  TempSrc:   Oral Oral  Resp: 14 23  20   Height:   5\' 10"  (1.778 m)   Weight:   102.967 kg (227 lb)   SpO2: 99% 98%  100%    Physical Exam  Constitutional: Appears well-developed and well-nourished. No distress.  HENT: Normocephalic. External right and left ear normal. Oropharynx is clear and moist.  Eyes: Conjunctivae and EOM are normal. PERRLA, no scleral icterus.  Neck: Normal ROM. Neck supple. No JVD. No tracheal deviation. No thyromegaly.  CVS: RRR, S1/S2 +, no murmurs, no gallops, no carotid bruit.  Pulmonary: Effort and breath sounds normal, no stridor, rhonchi, wheezes, rales.  Abdominal: Soft. BS +,  no distension, tenderness, rebound or guarding.  Musculoskeletal: Normal range of motion. No edema and no tenderness.  Lymphadenopathy: No lymphadenopathy noted, cervical, inguinal. Neuro: Alert. Normal reflexes, muscle tone coordination. No cranial nerve deficit. Skin: Skin is warm and dry. No rash noted. Not diaphoretic. No erythema. No pallor.  Psychiatric: Normal mood and affect. Behavior, judgment, thought content normal.   Labs on Admission:  Basic Metabolic Panel:  Recent Labs Lab 02/17/14 1400 02/17/14 1406  NA 136* 137  K 4.7 4.7  CL 98 100  CO2 26  --   GLUCOSE 104* 100*  BUN 28* 27*  CREATININE 2.28* 2.50*  CALCIUM 9.7  --    Liver Function Tests: No results found for this basename: AST, ALT, ALKPHOS, BILITOT, PROT, ALBUMIN,  in the last 168 hours No results found for this basename: LIPASE, AMYLASE,  in the last 168 hours No  results found for this basename: AMMONIA,  in the last 168 hours CBC:  Recent Labs Lab 02/17/14 1400 02/17/14 1406  WBC 6.0  --   HGB 13.4 13.9  HCT 38.7 41.0  MCV 85.1  --   PLT 203  --    Cardiac Enzymes: No results found for this basename: CKTOTAL, CKMB, CKMBINDEX, TROPONINI,  in the last 168 hours BNP: No components found with this basename: POCBNP,  CBG: No results found for this basename: GLUCAP,  in the last 168 hours  If 7PM-7AM, please contact night-coverage [email or web address] Password [facility] 02/17/2014, 5:51 PM

## 2014-02-17 NOTE — ED Notes (Addendum)
Pt has HTN and was at her PCP today and her BP 204/170, pt states that she hasnt taken her meds today but was given 2 clonodine tablets (pt not forsure dosage) and started to come down while in the office but PCP still felt she need to come to ED for further eval. Pt denies headache, blurred vision.

## 2014-02-17 NOTE — ED Notes (Signed)
Pt being sent from walk in clinic. Hx of HTN, is not taking meds. Being sent as precaution, due to medications not bringing BP down.  Clonidine given 0.2mg       1010 Blood pressure readings  1043  204/154                                           1057  182/148 Clonidine given 0.2 mg     1115                                           1141  185/138

## 2014-02-18 DIAGNOSIS — N179 Acute kidney failure, unspecified: Secondary | ICD-10-CM | POA: Diagnosis present

## 2014-02-18 DIAGNOSIS — N189 Chronic kidney disease, unspecified: Secondary | ICD-10-CM

## 2014-02-18 DIAGNOSIS — K5289 Other specified noninfective gastroenteritis and colitis: Secondary | ICD-10-CM

## 2014-02-18 LAB — CBC
HCT: 38.6 % (ref 36.0–46.0)
HEMOGLOBIN: 13.1 g/dL (ref 12.0–15.0)
MCH: 28.9 pg (ref 26.0–34.0)
MCHC: 33.9 g/dL (ref 30.0–36.0)
MCV: 85.2 fL (ref 78.0–100.0)
PLATELETS: 203 10*3/uL (ref 150–400)
RBC: 4.53 MIL/uL (ref 3.87–5.11)
RDW: 14.5 % (ref 11.5–15.5)
WBC: 6.8 10*3/uL (ref 4.0–10.5)

## 2014-02-18 LAB — PREGNANCY, URINE: PREG TEST UR: NEGATIVE

## 2014-02-18 LAB — COMPREHENSIVE METABOLIC PANEL
ALT: 16 U/L (ref 0–35)
AST: 11 U/L (ref 0–37)
Albumin: 3.7 g/dL (ref 3.5–5.2)
Alkaline Phosphatase: 57 U/L (ref 39–117)
BUN: 35 mg/dL — AB (ref 6–23)
CALCIUM: 9.8 mg/dL (ref 8.4–10.5)
CO2: 24 mEq/L (ref 19–32)
Chloride: 98 mEq/L (ref 96–112)
Creatinine, Ser: 2.29 mg/dL — ABNORMAL HIGH (ref 0.50–1.10)
GFR calc Af Amer: 30 mL/min — ABNORMAL LOW (ref >90)
GFR, EST NON AFRICAN AMERICAN: 26 mL/min — AB (ref >90)
GLUCOSE: 107 mg/dL — AB (ref 70–99)
Potassium: 4.1 mEq/L (ref 3.7–5.3)
Sodium: 136 mEq/L — ABNORMAL LOW (ref 137–147)
Total Bilirubin: 0.4 mg/dL (ref 0.3–1.2)
Total Protein: 7 g/dL (ref 6.0–8.3)

## 2014-02-18 LAB — GLUCOSE, CAPILLARY: Glucose-Capillary: 104 mg/dL — ABNORMAL HIGH (ref 70–99)

## 2014-02-18 LAB — TSH: TSH: 2.06 u[IU]/mL (ref 0.350–4.500)

## 2014-02-18 MED ORDER — SODIUM CHLORIDE 0.9 % IV SOLN
INTRAVENOUS | Status: DC
  Administered 2014-02-18 – 2014-02-19 (×2): via INTRAVENOUS

## 2014-02-18 MED ORDER — HYDRALAZINE HCL 20 MG/ML IJ SOLN
10.0000 mg | Freq: Four times a day (QID) | INTRAMUSCULAR | Status: DC | PRN
  Administered 2014-02-19: 10 mg via INTRAVENOUS
  Filled 2014-02-18: qty 0.5

## 2014-02-18 NOTE — Progress Notes (Signed)
PROGRESS NOTE    [name] DQQ:[id number] DOB: [birth year] DOA: 02/17/2014 PCP: Rachell Cipro, MD  HPI/Brief narrative 38 year old female patient with history of hypertension, headache, possible CKD admitted from PCPs office on 02/17/14 secondary to markedly elevated blood pressure 204/70 mmHg in the context of gastroenteritis and inability to take her antihypertensives.   Assessment/Plan:  1. Hypertensive urgency: Precipitated by inability to take her antihypertensives and the context of acute gastroenteritis. Now able to take by mouth. Continue oral Norvasc and clonidine. When necessary IV hydralazine. HCTZ and ARB held secondary to acute on chronic kidney disease. Monitor closely. 2. Acute gastroenteritis: Likely viral. No BM since 3/20 early AM. No nausea vomiting since last night. Tolerating diet. Monitor. 3. Acute on chronic kidney disease: Creatinine on 09/17/12 was 1.5. Admitting creatinine 2.28-? new baseline. Nephrotoxic medications. Briefly hydrate with IV fluids and follow up BMP in a.m. A renal ultrasound 08/20/10 showed no evidence of renal artery stenosis, developing chronic kidney disease. Advised to avoid aspirin/NSAIDs. Recommend outpatient nephrology consultation. 4. History of headache: None at this time.   Code Status: Full Family Communication: Discussed with patient's father at bedside. Disposition Plan: Home-possibly 3/25   Consultants:  None  Procedures:  None  Antibiotics:  None   Subjective: Feels much better. No further nausea, vomiting or diarrhea. Tolerated diet. Feels stronger.  Objective: Filed Vitals:   02/18/14 0019 02/18/14 0555 02/18/14 0613 02/18/14 1415  BP: 131/92 150/93 154/92 145/90  Pulse: 69 68 84 63  Temp:  97.9 F (36.6 C) 97.4 F (36.3 C) 97.2 F (36.2 C)  TempSrc:  Oral Oral Oral  Resp:  18 18 18   Height:      Weight:   101.833 kg (224 lb 8 oz)   SpO2:  100% 98% 98%    Intake/Output Summary (Last 24 hours) at  02/18/14 1457 Last data filed at 02/18/14 1418  Gross per 24 hour  Intake    240 ml  Output    700 ml  Net   -460 ml   Filed Weights   02/17/14 1701 02/18/14 0613  Weight: 102.967 kg (227 lb) 101.833 kg (224 lb 8 oz)     Exam:  General exam: Pleasant young female lying comfortably in bed. Respiratory system: Clear. No increased work of breathing. Cardiovascular system: S1 & S2 heard, RRR. No JVD, murmurs, gallops, clicks or pedal edema. Telemetry: Sinus bradycardia in the high 50s-sinus rhythm. Gastrointestinal system: Abdomen is nondistended, soft and nontender. Normal bowel sounds heard. Central nervous system: Alert and oriented. No focal neurological deficits. Extremities: Symmetric 5 x 5 power.   Data Reviewed: Basic Metabolic Panel:  Recent Labs Lab 02/17/14 1400 02/17/14 1406 02/17/14 1800 02/18/14 0407  NA 136* 137 137 136*  K 4.7 4.7 4.3 4.1  CL 98 100 98 98  CO2 26  --  26 24  GLUCOSE 104* 100* 95 107*  BUN 28* 27* 30* 35*  CREATININE 2.28* 2.50* 2.43* 2.29*  CALCIUM 9.7  --  10.2 9.8  MG  --   --  2.1  --   PHOS  --   --  4.1  --    Liver Function Tests:  Recent Labs Lab 02/17/14 1800 02/18/14 0407  AST 13 11  ALT 17 16  ALKPHOS 59 57  BILITOT 0.6 0.4  PROT 7.5 7.0  ALBUMIN 3.8 3.7   No results found for this basename: LIPASE, AMYLASE,  in the last 168 hours No results found for this basename: AMMONIA,  in the last 168 hours CBC:  Recent Labs Lab 02/17/14 1400 02/17/14 1406 02/17/14 1800 02/18/14 0407  WBC 6.0  --  6.8 6.8  NEUTROABS  --   --  3.7  --   HGB 13.4 13.9 13.8 13.1  HCT 38.7 41.0 40.0 38.6  MCV 85.1  --  84.6 85.2  PLT 203  --  227 203   Cardiac Enzymes: No results found for this basename: CKTOTAL, CKMB, CKMBINDEX, TROPONINI,  in the last 168 hours BNP (last 3 results) No results found for this basename: PROBNP,  in the last 8760 hours CBG:  Recent Labs Lab 02/18/14 0724  GLUCAP 104*    No results found for  this or any previous visit (from the past 240 hour(s)).    Additional labs: 1. TSH: 2.060 2. UDS: Positive for barbiturates.     Studies: No results found.      Scheduled Meds: . cloNIDine  0.2 mg Oral BID  . multivitamin with minerals  1 tablet Oral Daily   Continuous Infusions: . sodium chloride 75 mL/hr at 02/18/14 1045    Principal Problem:   Hypertensive urgency Active Problems:   Acute renal failure   Renal failure, acute on chronic    Time spent: 62 minutes    Elisha Cooksey, MD, FACP, FHM. [facility] Pager [phone]  If 7PM-7AM, please contact night-coverage [email or web address] Password [email or web address] 02/18/2014, 2:57 PM    LOS: 1 day

## 2014-02-19 DIAGNOSIS — J309 Allergic rhinitis, unspecified: Secondary | ICD-10-CM

## 2014-02-19 LAB — BASIC METABOLIC PANEL
BUN: 39 mg/dL — ABNORMAL HIGH (ref 6–23)
CHLORIDE: 99 meq/L (ref 96–112)
CO2: 25 mEq/L (ref 19–32)
CREATININE: 2.46 mg/dL — AB (ref 0.50–1.10)
Calcium: 9.2 mg/dL (ref 8.4–10.5)
GFR calc Af Amer: 28 mL/min — ABNORMAL LOW (ref >90)
GFR calc non Af Amer: 24 mL/min — ABNORMAL LOW (ref >90)
Glucose, Bld: 92 mg/dL (ref 70–99)
POTASSIUM: 4 meq/L (ref 3.7–5.3)
Sodium: 136 mEq/L — ABNORMAL LOW (ref 137–147)

## 2014-02-19 MED ORDER — OXYMETAZOLINE HCL 0.05 % NA SOLN: 1.0000 | Freq: Every day | NASAL | Status: DC | PRN

## 2014-02-19 MED ORDER — AMLODIPINE BESYLATE 10 MG PO TABS
10.0000 mg | ORAL_TABLET | Freq: Every day | ORAL | Status: DC
  Administered 2014-02-19: 10 mg via ORAL
  Filled 2014-02-19: qty 1

## 2014-02-19 MED ORDER — AMLODIPINE BESYLATE 10 MG PO TABS: 10.0000 mg | ORAL_TABLET | Freq: Every day | ORAL | Status: DC

## 2014-02-19 MED ORDER — CLONIDINE HCL 0.2 MG PO TABS
0.2000 mg | ORAL_TABLET | Freq: Three times a day (TID) | ORAL | Status: DC
  Administered 2014-02-19: 0.2 mg via ORAL
  Filled 2014-02-19 (×3): qty 1

## 2014-02-19 MED ORDER — LORATADINE 10 MG PO TABS: 10.0000 mg | ORAL_TABLET | Freq: Every day | ORAL | Status: DC

## 2014-02-19 MED ORDER — CLONIDINE HCL 0.2 MG PO TABS: 0.2000 mg | ORAL_TABLET | Freq: Three times a day (TID) | ORAL | Status: DC

## 2014-02-19 MED ORDER — LORAZEPAM 2 MG/ML IJ SOLN
0.5000 mg | Freq: Once | INTRAMUSCULAR | Status: AC
Start: 2014-02-19 — End: 2014-02-19
  Administered 2014-02-19: 0.5 mg via INTRAVENOUS
  Filled 2014-02-19: qty 1

## 2014-02-19 NOTE — Progress Notes (Signed)
Discharge instructions given to patient using teach back method.  Patient verbalized understanding.

## 2014-02-19 NOTE — Progress Notes (Signed)
Spoke with Mickel Baas at [employer].  Patient has not had any diarrhea since prior to admission early morning on 3/23 (actually is c/o slight constipation).  Will d/c isolation precautions.

## 2014-02-19 NOTE — Discharge Summary (Signed)
Physician Discharge Summary  FLORENDA WATT KXF:[id number] DOB: 11-20-1976 DOA: 02/17/2014  PCP: Rachell Cipro, MD  Admit date: 02/17/2014 Discharge date: 02/19/2014  Time spent: >30 minutes  Recommendations for Outpatient Follow-up:  1. BMET to follow electrolytes and renal function 2. Reassess BP and adjust medications as needed; HCTZ and lisinopril on hold due to acute on chronic renal failure. Might be able to start them if renal function stable.  Discharge Diagnoses:  Hypertensive urgency Renal failure, acute on chronic Allergic rhinitis  Gastroenteritis Dehydration  Discharge Condition: stable and improved. Will discharge home. Follow up with PCP in 10 days  Diet recommendation: low sodium diet   Filed Weights   02/17/14 1701 02/18/14 0613 02/19/14 0628  Weight: 102.967 kg (227 lb) 101.833 kg (224 lb 8 oz) 102.5 kg (225 lb 15.5 oz)    History of present illness:  38 year old female with past medical history of hypertension who presented to [facility] ED from PCP office due to high blood pressure, specifically 204/70. Pt reported she had ongoing nausea, vomiting and diarrhea for past week she reported she had stomach flu. She took her blood pressure meds but she had vomiting so she stopped taking blood pressure meds. No reports of headaches or blurry vision. No reports of chest pain, shortness of breath or palpitations. No reports of fevers. No blood in stool or urine.  In ED, BP was 194/142 adn with clonidine, avapro, hctz and Norvasc blood pressure went down to 157/105. Her blood work was significant for an acute elevation in creatinine, 2.38. Pt is not aware of any kidney problems in past.    Hospital Course:  1-Gastroenteritis: most likely viral in nature. Patient w/o further diarrhea since admission. After IVF's resuscitation and supportive care, symptoms of N/V also resolved. -patient advised to keep herself well hydrated  2-hypertensive urgency/accelerated HTN: due to rebound  HTN from stopping clonidine during acute gastroenteritis. Patient BP is better controlled now at discharge, but still elevated. -will discharge on clonidine 0.2mg  TID and amlodipine 10mg  daily -patient advised to follow low sodium diet   -close follow up with PCP to follow BP and adjust medications as needed -HCTZ and lisinopril on hold given acute on chronic renal failure  3-acute on chronic renal failure: stage 2-3 at baseline. Due to dehydration and continue use of nephrotoxic agents. -will continue holding nephrotoxic agents from now -Cr trending down -patient advise to keep herself well hydrated -patient advise to avoid use of NSAIDs  4-allergic rhinitis/sinusitis: no fever and no elevation of WBC's -started on loratadine 10mg  daily -no needs for antibiotics at this moment  5-Hx of migraine: no present at this moment. -continue PRN tylenol for pain.   Procedures:  See below for x-ray reports   Consultations:  None   Discharge Exam: Filed Vitals:   02/19/14 0628  BP: 182/102  Pulse:   Temp:   Resp:     General: Afebrile, NAD, feeling better. No N/V Cardiovascular: S1 and S2, no rubs or gallops Respiratory: CTA bilaterally Abd: soft, NT, ND, positive BS Extremities: no edema, no cyanosis Neuro: non focal.  Discharge Instructions  Discharge Orders   Future Orders Complete By Expires   Diet - low sodium heart healthy  As directed    Discharge instructions  As directed    Comments:     Follow a low sodium diet Keep yourself well hydrated Start using loratadine 10mg  daily       Medication List    STOP taking these medications  aspirin-acetaminophen-caffeine 250-250-65 MG per tablet  Commonly known as:  EXCEDRIN MIGRAINE     TRIBENZOR 40-10-25 MG Tabs  Generic drug:  Olmesartan-Amlodipine-HCTZ      TAKE these medications       amLODipine 10 MG tablet  Commonly known as:  NORVASC  Take 1 tablet (10 mg total) by mouth daily.     cloNIDine  0.2 MG tablet  Commonly known as:  CATAPRES  Take 1 tablet (0.2 mg total) by mouth 3 (three) times daily.     loratadine 10 MG tablet  Commonly known as:  CLARITIN  Take 1 tablet (10 mg total) by mouth daily.     multivitamin with minerals Tabs tablet  Take 1 tablet by mouth daily.     oxymetazoline 0.05 % nasal spray  Commonly known as:  AFRIN  Place 1 spray into both nostrils daily as needed for congestion. Do not use for > 4 days consecutively       No Known Allergies     Follow-up Information   Follow up with [facility], MD. Schedule an appointment as soon as possible for a visit in 10 days.   Specialty:  Family Medicine   Contact information:   [place] [address] [phone]       The results of significant diagnostics from this hospitalization (including imaging, microbiology, ancillary and laboratory) are listed below for reference.    Labs: Basic Metabolic Panel:  Recent Labs Lab 02/17/14 1400 02/17/14 1406 02/17/14 1800 02/18/14 0407 02/19/14 0400  NA 136* 137 137 136* 136*  K 4.7 4.7 4.3 4.1 4.0  CL 98 100 98 98 99  CO2 26  --  26 24 25   GLUCOSE 104* 100* 95 107* 92  BUN 28* 27* 30* 35* 39*  CREATININE 2.28* 2.50* 2.43* 2.29* 2.46*  CALCIUM 9.7  --  10.2 9.8 9.2  MG  --   --  2.1  --   --   PHOS  --   --  4.1  --   --    Liver Function Tests:  Recent Labs Lab 02/17/14 1800 02/18/14 0407  AST 13 11  ALT 17 16  ALKPHOS 59 57  BILITOT 0.6 0.4  PROT 7.5 7.0  ALBUMIN 3.8 3.7   CBC:  Recent Labs Lab 02/17/14 1400 02/17/14 1406 02/17/14 1800 02/18/14 0407  WBC 6.0  --  6.8 6.8  NEUTROABS  --   --  3.7  --   HGB 13.4 13.9 13.8 13.1  HCT 38.7 41.0 40.0 38.6  MCV 85.1  --  84.6 85.2  PLT 203  --  227 203   CBG:  Recent Labs Lab 02/18/14 0724  GLUCAP 104*    Signed:  Barton Dubois  [facility] 02/19/2014, 2:10 PM

## 2014-02-20 NOTE — Progress Notes (Signed)
Discharge summary sent to payer through [facility]  

## 2014-11-14 ENCOUNTER — Other Ambulatory Visit: Payer: Self-pay | Admitting: Family Medicine

## 2014-11-14 DIAGNOSIS — I10 Essential (primary) hypertension: Secondary | ICD-10-CM

## 2014-11-19 ENCOUNTER — Other Ambulatory Visit: Payer: 59

## 2014-11-24 ENCOUNTER — Ambulatory Visit
Admission: RE | Admit: 2014-11-24 | Discharge: 2014-11-24 | Disposition: A | Discharge status: Home / Self Care | Payer: 59 | Source: Ambulatory Visit | Attending: Family Medicine | Admitting: Family Medicine

## 2014-11-24 DIAGNOSIS — I10 Essential (primary) hypertension: Secondary | ICD-10-CM

## 2016-12-26 IMAGING — US US RENAL ARTERY STENOSIS
1 series · 14 of 25 positions shown · non-contrast
Comparison: None.

CLINICAL DATA: 38-year-old female with hypertension. Evaluate for
renal artery stenosis.

EXAM:
RENAL DUPLEX ULTRASOUND

[Series 1: us renal artery stenosis · 0.33mm/px · 14 of 39 slices shown]
[im 1/39]
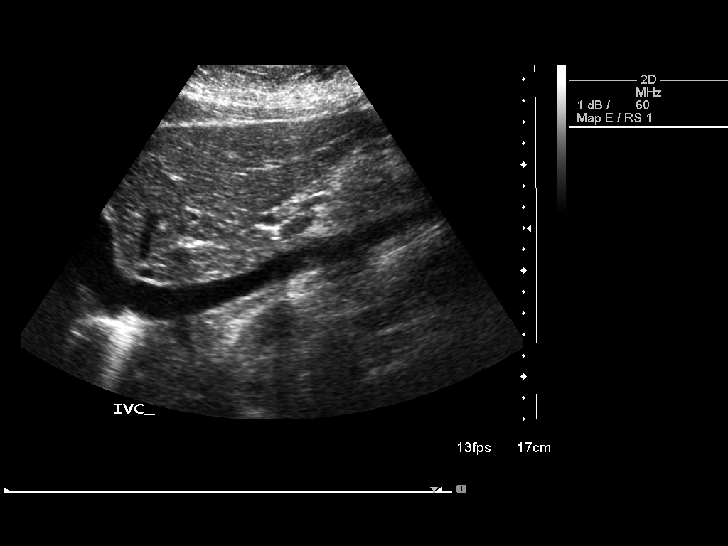
[im 4/39]
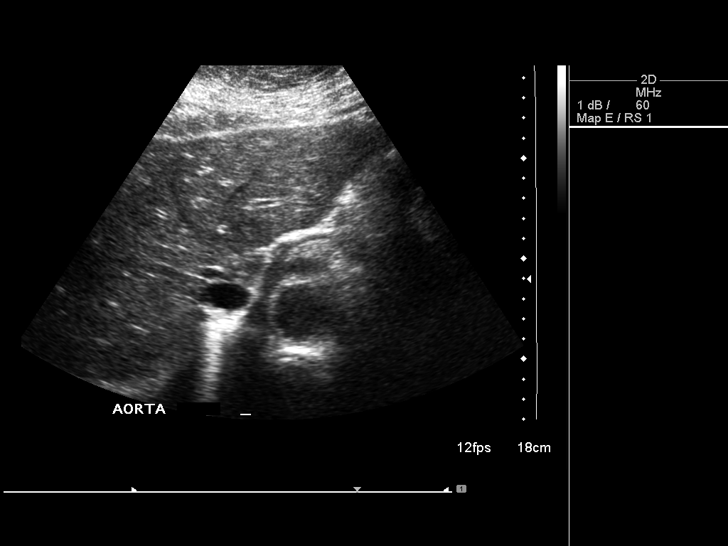
[im 7/39]
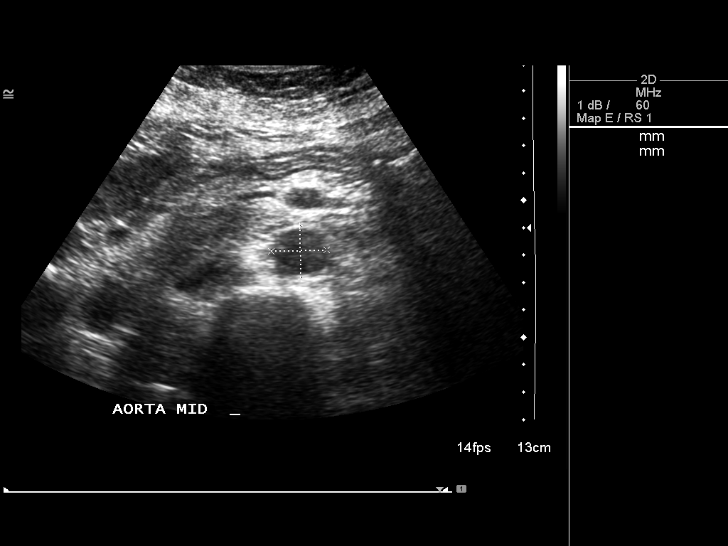
[im 10/39]
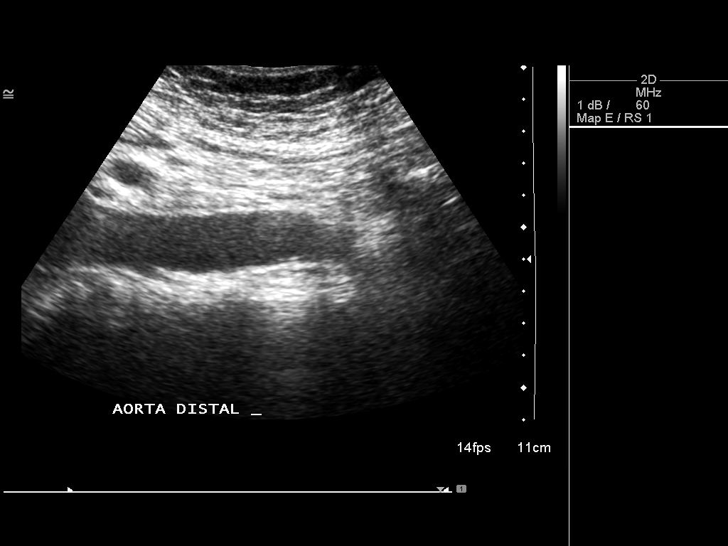
[im 13/39]
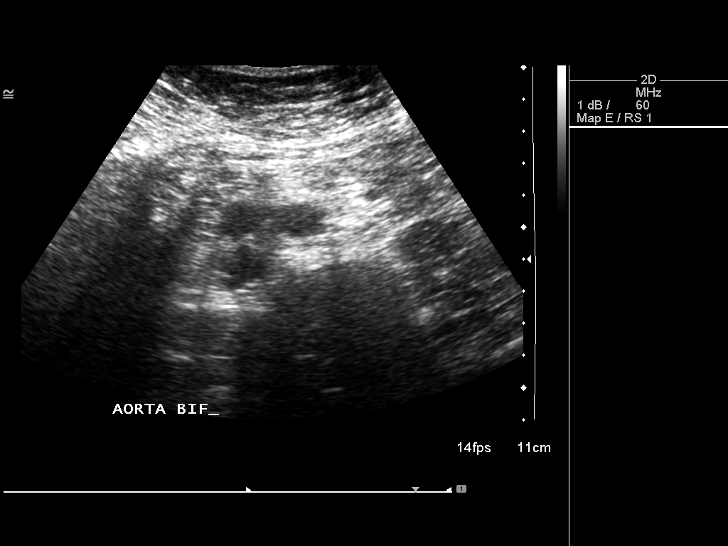
[im 15/39]
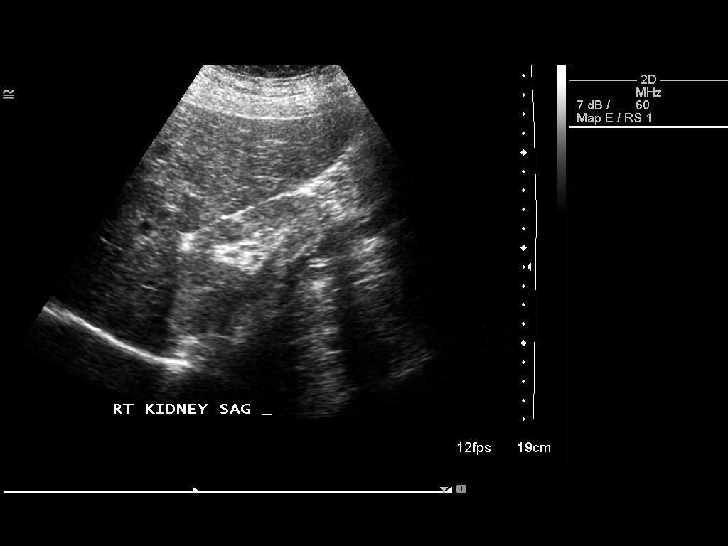
[im 18/39]
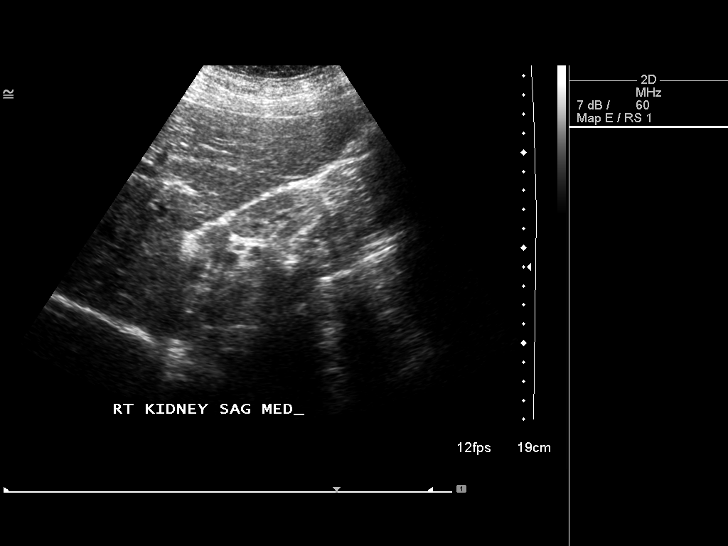
[im 21/39]
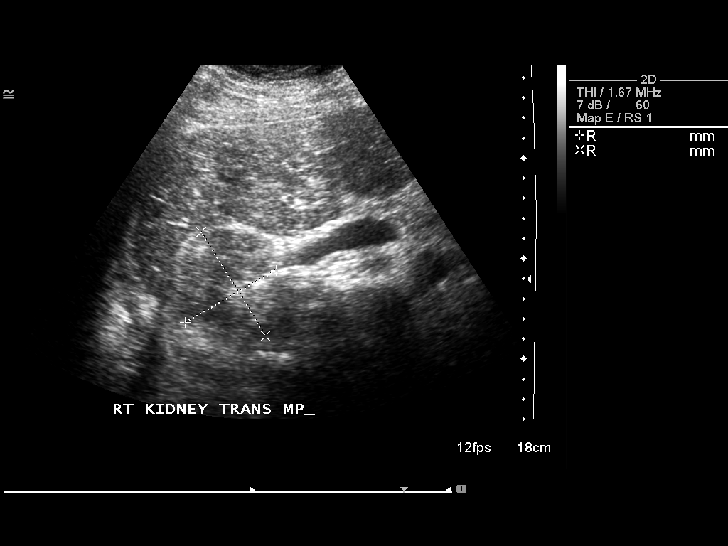
[im 24/39]
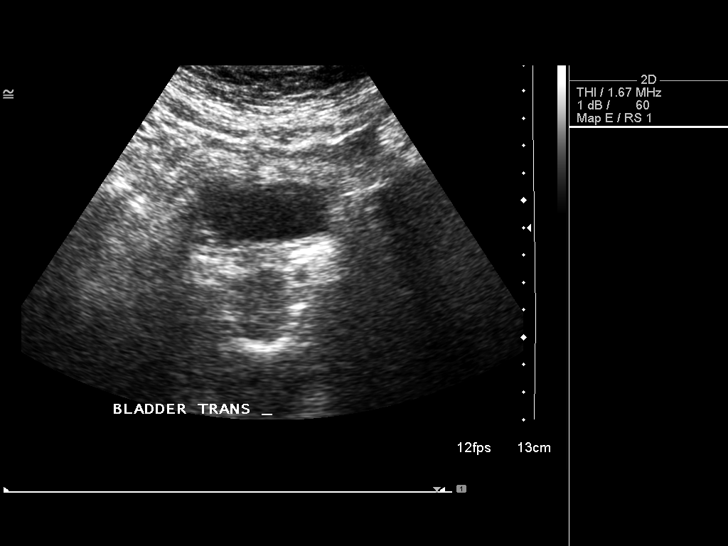
[im 26/39]
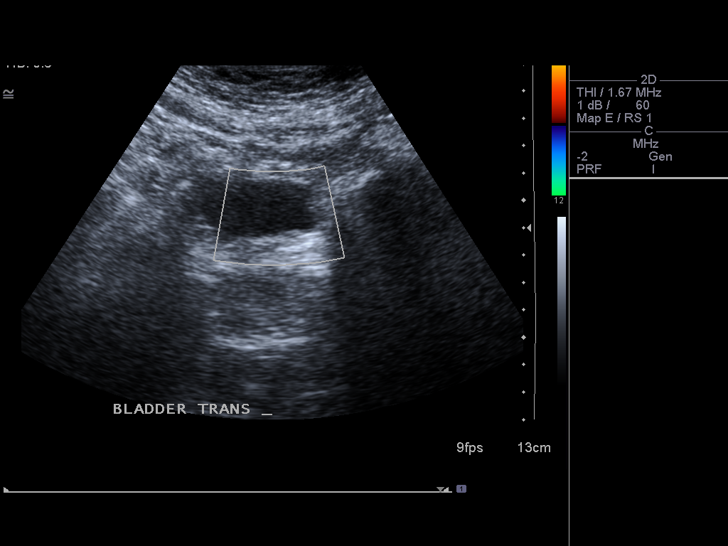
[im 29/39]
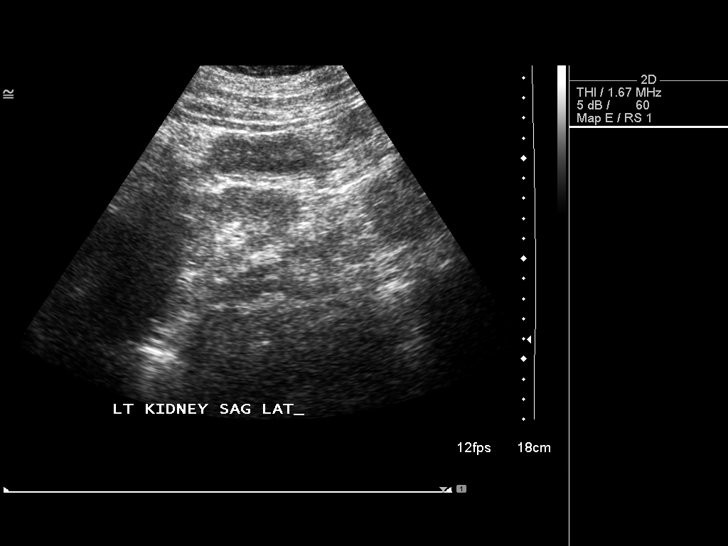
[im 32/39]
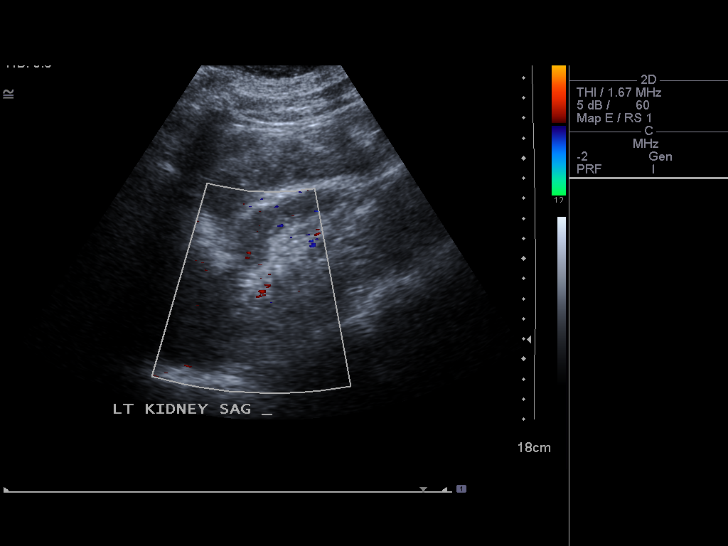
[im 35/39]
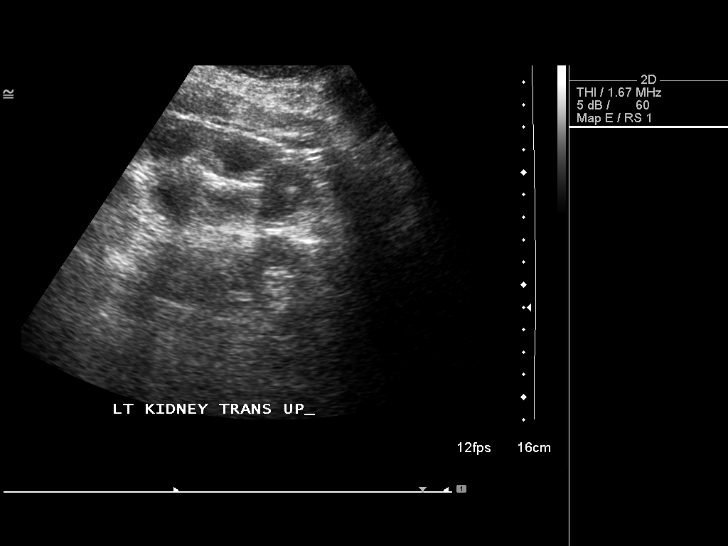
[im 39/39]
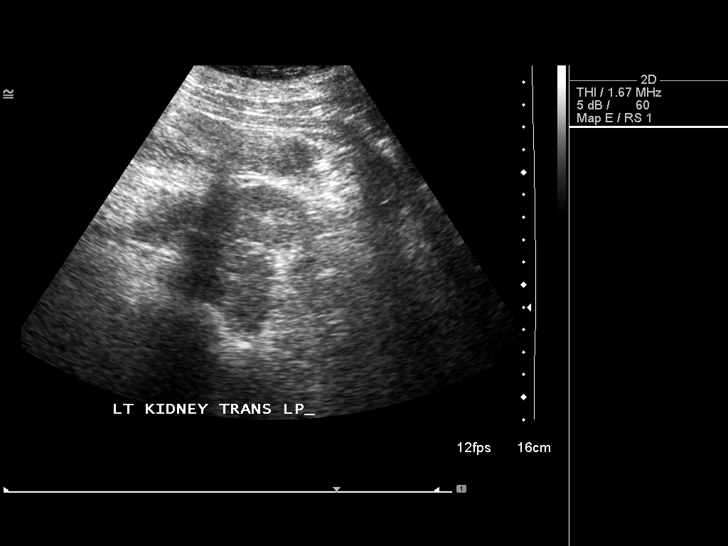

[14 of 25 positions shown; findings below may reference images not displayed]

FINDINGS: Right Kidney:

Length: 10.3 cm. Echogenicity within normal limits. No mass or
hydronephrosis visualized.

Left Kidney:

Length: 10.1 cm. Echogenicity within normal limits. No mass or
hydronephrosis visualized.

Bladder:  Within normal limits.

RENAL DUPLEX ULTRASOUND

Right Renal Artery Velocities:

Origin:  72 cm/sec

Mid:  139 cm/sec

Hilum:  76 cm/sec

Interlobar:  20 cm/sec

Arcuate:  26 Cm/sec

Left Renal Artery Velocities:

Origin:  49 cm/sec

Mid:  63 cm/sec

Hilum:  53 cm/sec

Interlobar:  21 cm/sec

Arcuate:  17 cm/sec

Aortic Velocity:  73 Cm/sec

Right Renal-Aortic Ratios:

Origin:

Mid:

Hilum:

Interlobar:

Arcuate:

Left Renal-Aortic Ratios:

Origin:

Mid:

Hilum:

Interlobar:

Arcuate:
IMPRESSION: No evidence of hemodynamically significant stenosis involving either
renal artery.

## 2017-02-28 LAB — OB RESULTS CONSOLE HIV ANTIBODY (ROUTINE TESTING): HIV: NONREACTIVE

## 2017-02-28 LAB — OB RESULTS CONSOLE ABO/RH: RH TYPE: POSITIVE

## 2017-02-28 LAB — OB RESULTS CONSOLE GC/CHLAMYDIA
CHLAMYDIA, DNA PROBE: NEGATIVE
Gonorrhea: NEGATIVE

## 2017-02-28 LAB — OB RESULTS CONSOLE RUBELLA ANTIBODY, IGM: Rubella: IMMUNE

## 2017-02-28 LAB — OB RESULTS CONSOLE HEPATITIS B SURFACE ANTIGEN: HEP B S AG: NEGATIVE

## 2017-02-28 LAB — OB RESULTS CONSOLE RPR: RPR: NONREACTIVE

## 2017-02-28 LAB — OB RESULTS CONSOLE ANTIBODY SCREEN: Antibody Screen: NEGATIVE

## 2017-08-15 ENCOUNTER — Encounter (HOSPITAL_COMMUNITY): Payer: Self-pay | Admitting: *Deleted

## 2017-08-15 ENCOUNTER — Inpatient Hospital Stay (HOSPITAL_COMMUNITY): Payer: 59

## 2017-08-15 ENCOUNTER — Inpatient Hospital Stay (HOSPITAL_COMMUNITY)
Admission: AD | Admit: 2017-08-15 | Discharge: 2017-08-15 | Disposition: A | Discharge status: Home / Self Care | Payer: 59 | Source: Ambulatory Visit | Attending: Obstetrics and Gynecology | Admitting: Obstetrics and Gynecology

## 2017-08-15 DIAGNOSIS — O163 Unspecified maternal hypertension, third trimester: Secondary | ICD-10-CM | POA: Insufficient documentation

## 2017-08-15 DIAGNOSIS — O368390 Maternal care for abnormalities of the fetal heart rate or rhythm, unspecified trimester, not applicable or unspecified: Secondary | ICD-10-CM | POA: Diagnosis present

## 2017-08-15 DIAGNOSIS — O09523 Supervision of elderly multigravida, third trimester: Secondary | ICD-10-CM | POA: Insufficient documentation

## 2017-08-15 DIAGNOSIS — O288 Other abnormal findings on antenatal screening of mother: Secondary | ICD-10-CM

## 2017-08-15 DIAGNOSIS — Z3A34 34 weeks gestation of pregnancy: Secondary | ICD-10-CM | POA: Diagnosis not present

## 2017-08-15 NOTE — MAU Provider Note (Signed)
History     Chief Complaint  Patient presents with  . Non-stress Test  41 yo G4P2 BF @ 34 3/[redacted] weeks gestation sent from the office by Dr Benjie Karvonen for prolonged fetal monitoring due to deceleration noted on NST in the office. Per pt, she was having the NST for her chronic HTN (+) FM Office tracing not avail for review  OB History    Gravida Para Term Preterm AB Living   4 2 2    1 2    SAB TAB Ectopic Multiple Live Births           2      Past Medical History:  Diagnosis Date  . Headache(784.0)   . Hypertension     Past Surgical History:  Procedure Laterality Date  . NO PAST SURGERIES    . WISDOM TOOTH EXTRACTION Bilateral     History reviewed. No pertinent family history.  Social History  Substance Use Topics  . Smoking status: Never Smoker  . Smokeless tobacco: Never Used  . Alcohol use No    Allergies: No Known Allergies  Prescriptions Prior to Admission  Medication Sig Dispense Refill Last Dose  . ferrous sulfate 325 (65 FE) MG tablet Take 325 mg by mouth daily with breakfast.   08/14/2017 at Unknown time  . labetalol (NORMODYNE) 300 MG tablet Take 300 mg by mouth 3 (three) times daily.   08/15/2017 at 0600  . NIFEdipine (PROCARDIA XL/ADALAT-CC) 90 MG 24 hr tablet Take 90 mg by mouth daily.   08/15/2017 at 0600  . Prenatal Vit-Fe Fumarate-FA (PRENATAL MULTIVITAMIN) TABS tablet Take 1 tablet by mouth daily at 12 noon.   08/14/2017 at Unknown time     Physical Exam   Blood pressure 133/88, temperature 98.3 F (36.8 C), temperature source Oral, resp. rate 18, height 5\' 10"  (1.778 m), weight 108 kg (238 lb), SpO2 100 %.  No exam performed today, done in office.  Tracing: baseline 130 small accel NR no ctx. No decel  ED Course  Chronic HTN in pregnancy( third trim) AMA IUP @ 34 3/7 weeks NR NST P) BPP MDM   Chloeanne Poteet A, MD 12:20 PM 08/15/2017

## 2017-08-15 NOTE — MAU Note (Signed)
Pt sent to MAU for prolonged EFM secondary non-reactive NST in office today.

## 2017-08-15 NOTE — MAU Note (Signed)
Pt in office today for fetal monitoring and was non-reactive.

## 2017-08-15 NOTE — MAU Provider Note (Signed)
Pt reassessed. NST improved to reactive BPP 6/8 (-2 for breathing)  Plan: FAC, preterm labor and PEC s/s reviewed.  F/up on Fri 9/21 for NST/BPP in office.   V.Cletus Mehlhoff, MD

## 2017-08-15 NOTE — Progress Notes (Signed)
Dr. Benjie Karvonen notified regarding pt's ^BP prior to discharge. MD informed pt has Hx of CHTN and hasn't taken 1400 dose of Labetalol d/t in MAU.  MS said pt may go home & take med once she's home.

## 2017-08-15 NOTE — Discharge Instructions (Signed)

## 2017-08-18 ENCOUNTER — Encounter (HOSPITAL_COMMUNITY): Payer: Self-pay | Admitting: *Deleted

## 2017-08-18 ENCOUNTER — Observation Stay (HOSPITAL_COMMUNITY)
Admission: AD | Admit: 2017-08-18 | Discharge: 2017-08-20 | Disposition: A | Discharge status: Home / Self Care | Payer: 59 | Source: Ambulatory Visit | Attending: Obstetrics | Admitting: Obstetrics

## 2017-08-18 DIAGNOSIS — O26833 Pregnancy related renal disease, third trimester: Secondary | ICD-10-CM | POA: Insufficient documentation

## 2017-08-18 DIAGNOSIS — O10013 Pre-existing essential hypertension complicating pregnancy, third trimester: Secondary | ICD-10-CM | POA: Diagnosis not present

## 2017-08-18 DIAGNOSIS — R7989 Other specified abnormal findings of blood chemistry: Secondary | ICD-10-CM | POA: Insufficient documentation

## 2017-08-18 DIAGNOSIS — Z3689 Encounter for other specified antenatal screening: Secondary | ICD-10-CM

## 2017-08-18 DIAGNOSIS — O368390 Maternal care for abnormalities of the fetal heart rate or rhythm, unspecified trimester, not applicable or unspecified: Secondary | ICD-10-CM

## 2017-08-18 DIAGNOSIS — Z79899 Other long term (current) drug therapy: Secondary | ICD-10-CM | POA: Diagnosis not present

## 2017-08-18 DIAGNOSIS — O3413 Maternal care for benign tumor of corpus uteri, third trimester: Secondary | ICD-10-CM | POA: Insufficient documentation

## 2017-08-18 DIAGNOSIS — Z3A34 34 weeks gestation of pregnancy: Secondary | ICD-10-CM | POA: Insufficient documentation

## 2017-08-18 DIAGNOSIS — O09523 Supervision of elderly multigravida, third trimester: Secondary | ICD-10-CM | POA: Diagnosis not present

## 2017-08-18 DIAGNOSIS — O10919 Unspecified pre-existing hypertension complicating pregnancy, unspecified trimester: Secondary | ICD-10-CM

## 2017-08-18 DIAGNOSIS — O368131 Decreased fetal movements, third trimester, fetus 1: Secondary | ICD-10-CM | POA: Diagnosis not present

## 2017-08-18 LAB — COMPREHENSIVE METABOLIC PANEL
ALBUMIN: 3.1 g/dL — AB (ref 3.5–5.0)
ALT: 12 U/L — ABNORMAL LOW (ref 14–54)
AST: 11 U/L — AB (ref 15–41)
Alkaline Phosphatase: 63 U/L (ref 38–126)
Anion gap: 8 (ref 5–15)
BUN: 26 mg/dL — AB (ref 6–20)
CO2: 21 mmol/L — AB (ref 22–32)
Calcium: 8.8 mg/dL — ABNORMAL LOW (ref 8.9–10.3)
Chloride: 104 mmol/L (ref 101–111)
Creatinine, Ser: 1.69 mg/dL — ABNORMAL HIGH (ref 0.44–1.00)
GFR calc Af Amer: 42 mL/min — ABNORMAL LOW (ref >60)
GFR, EST NON AFRICAN AMERICAN: 36 mL/min — AB (ref >60)
Glucose, Bld: 95 mg/dL (ref 65–99)
POTASSIUM: 4.2 mmol/L (ref 3.5–5.1)
SODIUM: 133 mmol/L — AB (ref 135–145)
Total Bilirubin: 0.5 mg/dL (ref 0.3–1.2)
Total Protein: 7 g/dL (ref 6.5–8.1)

## 2017-08-18 LAB — CBC
HCT: 28.2 % — ABNORMAL LOW (ref 36.0–46.0)
Hemoglobin: 9.5 g/dL — ABNORMAL LOW (ref 12.0–15.0)
MCH: 29.6 pg (ref 26.0–34.0)
MCHC: 33.7 g/dL (ref 30.0–36.0)
MCV: 87.9 fL (ref 78.0–100.0)
PLATELETS: 253 10*3/uL (ref 150–400)
RBC: 3.21 MIL/uL — AB (ref 3.87–5.11)
RDW: 15.2 % (ref 11.5–15.5)
WBC: 9 10*3/uL (ref 4.0–10.5)

## 2017-08-18 LAB — PROTEIN / CREATININE RATIO, URINE
Creatinine, Urine: 228 mg/dL
Protein Creatinine Ratio: 0.09 mg/mg{Cre} (ref 0.00–0.15)
Total Protein, Urine: 21 mg/dL

## 2017-08-18 LAB — URINALYSIS, ROUTINE W REFLEX MICROSCOPIC
BILIRUBIN URINE: NEGATIVE
Glucose, UA: NEGATIVE mg/dL
Hgb urine dipstick: NEGATIVE
Ketones, ur: NEGATIVE mg/dL
Leukocytes, UA: NEGATIVE
Nitrite: NEGATIVE
PROTEIN: 30 mg/dL — AB
SPECIFIC GRAVITY, URINE: 1.019 (ref 1.005–1.030)
pH: 5 (ref 5.0–8.0)

## 2017-08-18 MED ORDER — CALCIUM CARBONATE ANTACID 500 MG PO CHEW: 2.0000 | CHEWABLE_TABLET | ORAL | Status: DC | PRN

## 2017-08-18 MED ORDER — ACETAMINOPHEN 325 MG PO TABS: 650.0000 mg | ORAL_TABLET | ORAL | Status: DC | PRN

## 2017-08-18 MED ORDER — ZOLPIDEM TARTRATE 5 MG PO TABS: 5.0000 mg | ORAL_TABLET | Freq: Every evening | ORAL | Status: DC | PRN

## 2017-08-18 MED ORDER — PRENATAL MULTIVITAMIN CH
1.0000 | ORAL_TABLET | Freq: Every day | ORAL | Status: DC
  Administered 2017-08-19: 1 via ORAL
  Filled 2017-08-18: qty 1

## 2017-08-18 MED ORDER — LABETALOL HCL 200 MG PO TABS
450.0000 mg | ORAL_TABLET | Freq: Three times a day (TID) | ORAL | Status: DC
  Administered 2017-08-19 (×3): 450 mg via ORAL
  Filled 2017-08-18 (×3): qty 1

## 2017-08-18 MED ORDER — LABETALOL HCL 300 MG PO TABS
450.0000 mg | ORAL_TABLET | Freq: Once | ORAL | Status: AC
  Administered 2017-08-18: 450 mg via ORAL
  Filled 2017-08-18: qty 1.5

## 2017-08-18 MED ORDER — SODIUM CHLORIDE 0.9 % IV SOLN: 250.0000 mL | INTRAVENOUS | Status: DC | PRN

## 2017-08-18 MED ORDER — DOCUSATE SODIUM 100 MG PO CAPS
100.0000 mg | ORAL_CAPSULE | Freq: Every day | ORAL | Status: DC
  Filled 2017-08-18: qty 1

## 2017-08-18 MED ORDER — SODIUM CHLORIDE 0.9% FLUSH
3.0000 mL | Freq: Two times a day (BID) | INTRAVENOUS | Status: DC
  Administered 2017-08-19: 3 mL via INTRAVENOUS

## 2017-08-18 MED ORDER — SODIUM CHLORIDE 0.9% FLUSH: 3.0000 mL | INTRAVENOUS | Status: DC | PRN

## 2017-08-18 MED ORDER — NIFEDIPINE ER OSMOTIC RELEASE 30 MG PO TB24
90.0000 mg | ORAL_TABLET | Freq: Once | ORAL | Status: AC
  Administered 2017-08-19: 90 mg via ORAL
  Filled 2017-08-18: qty 3

## 2017-08-18 MED ORDER — BETAMETHASONE SOD PHOS & ACET 6 (3-3) MG/ML IJ SUSP
12.0000 mg | Freq: Once | INTRAMUSCULAR | Status: AC
  Administered 2017-08-18: 12 mg via INTRAMUSCULAR
  Filled 2017-08-18: qty 2

## 2017-08-18 MED ORDER — BETAMETHASONE SOD PHOS & ACET 6 (3-3) MG/ML IJ SUSP
12.0000 mg | Freq: Once | INTRAMUSCULAR | Status: AC
  Administered 2017-08-19: 12 mg via INTRAMUSCULAR
  Filled 2017-08-18: qty 2

## 2017-08-18 NOTE — H&P (Signed)
[name] is a 41 y.o. G3P2002 at [redacted]w[redacted]d presenting for hypertension. Pt notes no contractions. Good fetal movement, No vaginal bleeding, not leaking fluid. Pt seen for routine OB visit 2 days ago, BPP 6/10 and returned today for f/u testing BPP 8/8 with reactive NST in office. Pt reports no HA, no vision change, no scotomata, no RUQ pain. No VB, no LOF, good FM  [facility] at [facility] since 10 wks - Chronic hypertension with renal insufficiency. Seen at the renal clinic. Needed early adjustment of bp meds- after 20 wks had very good control on Procardia XL 90mg  and Labetalol 450mg  tid. Baseline Cr. 1.3 and badeline 24 hr urine 334mg  - h/o LEEP - fibroids- 7 cm - Prior SVD x 2 (kids 13 and 17), new partner (w/o prior paternity) - AMA. Nl cell free DNA, nl AFP - Fetal growth- AGA at 33 wks   Prenatal Transfer Tool  Maternal Diabetes: No Genetic Screening: Normal Maternal Ultrasounds/Referrals: Normal Fetal Ultrasounds or other Referrals:  None Maternal Substance Abuse:  No Significant Maternal Medications:  Meds include: Other:  Significant Maternal Lab Results: None     OB History    Gravida Para Term Preterm AB Living   3 2 2     2    SAB TAB Ectopic Multiple Live Births           2     Past Medical History:  Diagnosis Date  . Headache(784.0)   . Hypertension    Past Surgical History:  Procedure Laterality Date  . NO PAST SURGERIES    . WISDOM TOOTH EXTRACTION Bilateral    Family History: family history is not on file. Social History:  reports that she has never smoked. She has never used smokeless tobacco. She reports that she does not drink alcohol or use drugs.  Review of Systems - Negative except discomfort of preg     Blood pressure (!) 153/98, pulse 61, temperature 98.6 F (37 C), temperature source Oral, resp. rate 20, weight 108.4 kg (239 lb 0.6 oz), SpO2 99 %.   Vitals:   08/18/17 2200 08/18/17 2206 08/18/17 2215 08/18/17 2241  BP: (!) 168/96 (!)  162/101 (!) 168/100 (!) 153/98  Pulse: 62 62 61 61  Resp:      Temp:      TempSrc:      SpO2:      Weight:         Physical Exam:  Gen: well appearing, no distress CV: RRR Pulm: CTAB Back: no CVAT Abd: gravid, NT, no RUQ pain LE: trace edema, equal bilaterally, non-tender Toco: rare FH: baseline 130s, accelerations present/ rare, no deceleratons, 10 beat variability  Prenatal labs: ABO, Rh:   Antibody:  neg Rubella: !Error! RPR:   NR HBsAg:  neg  HIV:   neg GBS:   not yet done 1 hr Glucola 125  Genetic screening nl AFP,nl Panorama Anatomy US nl   Assessment/Plan: 41 y.o. G3P2002 at [redacted]w[redacted]d New increase in bps in a pt with previously adequate controlled bp, concern for developing PEC/ severe.  Pt with several severe range bps here in MAU, though may be due to stress/ prolonged monitoring in MAU/ missed home bp med/ lack of food. Increase in Creatinine. HIgh risk pt given baseline hypertensive renal disease and AMA.  Pt agrees to admission for observation given high risk for PEC and worsening maternal/ fetl status. IOL is certainly an option, but would like to follow expectantly overnight. Continue home meds of  labetalol 450mg  tid and Procardia XL90mg .  - Continuous fetal monitoring - BMZ #2 tomorrow, would not alter delivery plan for BMZ  - check GBS - Anemia, Fibroids, bleeding risk, cont iron  Jorryn Casagrande A. 08/18/2017, 10:54 PM

## 2017-08-18 NOTE — Progress Notes (Signed)
Pt up to BR

## 2017-08-18 NOTE — MAU Provider Note (Signed)
Chief Complaint:  Hypertension and non reactive nst   None     HPI: [name] is a 41 y.o. G3P2002 at [redacted]w[redacted]d who presents to maternity admissions for extended fetal monitoring monitoring.  She had 6/8 BPP with -2 for breathing on 9/18, then had follow up BPP today that was 8/8. She has antenatal testing for [facility] and is on labetalol 450 mg TID currently. She denies h/a, epigastric pain, or visual disturbances today. She reports feeling well today.  She has taken her BP medications as prescribed today, and has not needed any other treatment.  There are no other associated symptoms. She reports good fetal movement, denies LOF, vaginal bleeding, vaginal itching/burning, urinary symptoms, h/a, dizziness, n/v, or fever/chills.    HPI  Past Medical History: Past Medical History:  Diagnosis Date  . Headache(784.0)   . Hypertension     Past obstetric history: OB History  Gravida Para Term Preterm AB Living  3 2 2     2   SAB TAB Ectopic Multiple Live Births          2    # Outcome Date GA Lbr Len/2nd Weight Sex Delivery Anes PTL Lv  3 Current           2 Term           1 Term               Past Surgical History: Past Surgical History:  Procedure Laterality Date  . NO PAST SURGERIES    . WISDOM TOOTH EXTRACTION Bilateral     Family History: No family history on file.  Social History: Social History  Substance Use Topics  . Smoking status: Never Smoker  . Smokeless tobacco: Never Used  . Alcohol use No    Allergies: No Known Allergies  Meds:  Prescriptions Prior to Admission  Medication Sig Dispense Refill Last Dose  . calcium carbonate (TUMS - DOSED IN MG ELEMENTAL CALCIUM) 500 MG chewable tablet Chew 2 tablets by mouth 3 (three) times daily as needed for indigestion or heartburn.   08/18/2017 at Unknown time  . ferrous sulfate 325 (65 FE) MG tablet Take 325 mg by mouth daily with breakfast.   08/18/2017 at Unknown time  . labetalol (NORMODYNE) 300 MG tablet Take 450  mg by mouth 3 (three) times daily.    08/18/2017 at 1400  . NIFEdipine (PROCARDIA XL/ADALAT-CC) 90 MG 24 hr tablet Take 90 mg by mouth daily.   08/18/2017 at Unknown time  . Prenatal Vit-Fe Fumarate-FA (PRENATAL MULTIVITAMIN) TABS tablet Take 1 tablet by mouth every evening.    08/17/2017 at Unknown time    ROS:  Review of Systems  Constitutional: Negative for chills, fatigue and fever.  Eyes: Negative for visual disturbance.  Respiratory: Negative for shortness of breath.   Cardiovascular: Negative for chest pain.  Gastrointestinal: Negative for abdominal pain, nausea and vomiting.  Genitourinary: Negative for difficulty urinating, dysuria, flank pain, pelvic pain, vaginal bleeding, vaginal discharge and vaginal pain.  Neurological: Negative for dizziness and headaches.  Psychiatric/Behavioral: Negative.      I have reviewed patient's Past Medical Hx, Surgical Hx, Family Hx, Social Hx, medications and allergies.   Physical Exam   Patient Vitals for the past 24 hrs:  BP Temp Temp src Pulse Resp SpO2 Weight  08/18/17 2241 (!) 153/98 - - 61 - - -  08/18/17 2215 (!) 168/100 - - 61 - - -  08/18/17 2206 (!) 162/101 - - 62 - - -  08/18/17 2200 (!) 168/96 - - 62 - - -  08/18/17 2146 133/80 - - 71 - - -  08/18/17 2143 (!) 167/101 - - 68 - - -  08/18/17 2115 (!) 169/96 - - 63 - - -  08/18/17 2100 (!) 168/100 - - 61 - - -  08/18/17 2042 (!) 162/95 - - 64 - - -  08/18/17 2027 (!) 169/90 - - 62 - - -  08/18/17 1754 137/89 - - 70 20 - -  08/18/17 1744 136/90 98.6 F (37 C) Oral 66 20 99 % 239 lb 0.6 oz (108.4 kg)   Constitutional: Well-developed, well-nourished female in no acute distress.  HEART: normal rate, heart sounds, regular rhythm RESP: normal effort, lung sounds clear and equal bilaterally GI: Abd soft, non-tender, gravid appropriate for gestational age.  MS: Extremities nontender, no edema, normal ROM Neurologic: Alert and oriented x 4.  GU: Neg CVAT.     FHT:  Baseline 125  , moderate variability, accelerations present, no decelerations Contractions: irregular, 2-15 minutes, mild to palpation   Labs: Results for orders placed or performed during the hospital encounter of 08/18/17 (from the past 24 hour(s))  Protein / creatinine ratio, urine     Status: None   Collection Time: 08/18/17  6:10 PM  Result Value Ref Range   Creatinine, Urine 228.00 mg/dL   Total Protein, Urine 21 mg/dL   Protein Creatinine Ratio 0.09 0.00 - 0.15 mg/mg[Cre]  CBC     Status: Abnormal   Collection Time: 08/18/17  6:18 PM  Result Value Ref Range   WBC 9.0 4.0 - 10.5 K/uL   RBC 3.21 (L) 3.87 - 5.11 MIL/uL   Hemoglobin 9.5 (L) 12.0 - 15.0 g/dL   HCT 28.2 (L) 36.0 - 46.0 %   MCV 87.9 78.0 - 100.0 fL   MCH 29.6 26.0 - 34.0 pg   MCHC 33.7 30.0 - 36.0 g/dL   RDW 15.2 11.5 - 15.5 %   Platelets 253 150 - 400 K/uL  Comprehensive metabolic panel     Status: Abnormal   Collection Time: 08/18/17  6:18 PM  Result Value Ref Range   Sodium 133 (L) 135 - 145 mmol/L   Potassium 4.2 3.5 - 5.1 mmol/L   Chloride 104 101 - 111 mmol/L   CO2 21 (L) 22 - 32 mmol/L   Glucose, Bld 95 65 - 99 mg/dL   BUN 26 (H) 6 - 20 mg/dL   Creatinine, Ser 1.69 (H) 0.44 - 1.00 mg/dL   Calcium 8.8 (L) 8.9 - 10.3 mg/dL   Total Protein 7.0 6.5 - 8.1 g/dL   Albumin 3.1 (L) 3.5 - 5.0 g/dL   AST 11 (L) 15 - 41 U/L   ALT 12 (L) 14 - 54 U/L   Alkaline Phosphatase 63 38 - 126 U/L   Total Bilirubin 0.5 0.3 - 1.2 mg/dL   GFR calc non Af Amer 36 (L) >60 mL/min   GFR calc Af Amer 42 (L) >60 mL/min   Anion gap 8 5 - 15  Urinalysis, Routine w reflex microscopic     Status: Abnormal   Collection Time: 08/18/17  7:30 PM  Result Value Ref Range   Color, Urine YELLOW YELLOW   APPearance CLEAR CLEAR   Specific Gravity, Urine 1.019 1.005 - 1.030   pH 5.0 5.0 - 8.0   Glucose, UA NEGATIVE NEGATIVE mg/dL   Hgb urine dipstick NEGATIVE NEGATIVE   Bilirubin Urine NEGATIVE NEGATIVE   Ketones, ur NEGATIVE NEGATIVE  mg/dL    Protein, ur 30 (A) NEGATIVE mg/dL   Nitrite NEGATIVE NEGATIVE   Leukocytes, UA NEGATIVE NEGATIVE   RBC / HPF 0-5 0 - 5 RBC/hpf   WBC, UA 0-5 0 - 5 WBC/hpf   Bacteria, UA RARE (A) NONE SEEN   Squamous Epithelial / LPF 0-5 (A) NONE SEEN   Mucus PRESENT       Imaging:    MAU Course/MDM: I have ordered labs and reviewed results.  NST reviewed and reactive Initially BP borderline, 130s/90s, NST reactive so 10/10 on testing today, and pt without preeclampsia symptoms Labwork all wnl, creatinine stable from previous labs, P/C ratio 0.09 Pt declined betamethasone at first, and wanted more information about the medication and plan for delivery Discussed risks of superimposed preeclampsia with [facility] and preexisting kidney disease. This may warrant delivering the pt early and we want to be prepared by giving steroids Pt agrees to steroids and bmz given BP rose to 160s/90s while in MAU  Consult Dr Pamala Hurry with presentation, exam findings and test results.  Pt scheduled dose of labetalol 450 mg PO given and BP rechecked, BP remains high at 160s/100s. Pt remains asymptomatic.   Dr Pamala Hurry to bedside to discuss options with pt Treatments in MAU included BMZ x1 and labetalol 450 mg PO.   Pt admitted for observation with severe range BPs  Assessment: 1. Chronic hypertension affecting pregnancy   2. NST (non-stress test) reactive     Plan: Admit to [facility] unit Dr Pamala Hurry to continue care of pt   Fatima Blank Certified Nurse-Midwife 08/18/2017 10:50 PM

## 2017-08-18 NOTE — MAU Note (Signed)
Pt sent from MD office for prolonged EFM, steriods, BP eval, and labs.  Pt has Hx CHTN, takes Labetalol and Procardia.    Denies H/A, visial disturbances, epigastric pain.  DTR's 1+, no clonus

## 2017-08-18 NOTE — MAU Note (Signed)
Patient presents to mau for non reactive nst. Had BPP 8/8 Needs evaluation patient states for blood pressure and have BMZ injection, which patient expresses that she may not do.  Denies headache, changes in vision, epigastric pain, or increase in swelling.

## 2017-08-19 LAB — COMPREHENSIVE METABOLIC PANEL
ALT: 12 U/L — AB (ref 14–54)
AST: 12 U/L — AB (ref 15–41)
Albumin: 3.1 g/dL — ABNORMAL LOW (ref 3.5–5.0)
Alkaline Phosphatase: 65 U/L (ref 38–126)
Anion gap: 9 (ref 5–15)
BUN: 22 mg/dL — AB (ref 6–20)
CHLORIDE: 104 mmol/L (ref 101–111)
CO2: 20 mmol/L — AB (ref 22–32)
Calcium: 9.3 mg/dL (ref 8.9–10.3)
Creatinine, Ser: 1.32 mg/dL — ABNORMAL HIGH (ref 0.44–1.00)
GFR calc Af Amer: 57 mL/min — ABNORMAL LOW (ref >60)
GFR calc non Af Amer: 49 mL/min — ABNORMAL LOW (ref >60)
GLUCOSE: 114 mg/dL — AB (ref 65–99)
Potassium: 4.5 mmol/L (ref 3.5–5.1)
Sodium: 133 mmol/L — ABNORMAL LOW (ref 135–145)
Total Bilirubin: 0.5 mg/dL (ref 0.3–1.2)
Total Protein: 7.1 g/dL (ref 6.5–8.1)

## 2017-08-19 LAB — URIC ACID: URIC ACID, SERUM: 7.8 mg/dL — AB (ref 2.3–6.6)

## 2017-08-19 LAB — CBC
HCT: 30.3 % — ABNORMAL LOW (ref 36.0–46.0)
Hemoglobin: 10.4 g/dL — ABNORMAL LOW (ref 12.0–15.0)
MCH: 29.8 pg (ref 26.0–34.0)
MCHC: 34.3 g/dL (ref 30.0–36.0)
MCV: 86.8 fL (ref 78.0–100.0)
PLATELETS: 263 10*3/uL (ref 150–400)
RBC: 3.49 MIL/uL — ABNORMAL LOW (ref 3.87–5.11)
RDW: 15.1 % (ref 11.5–15.5)
WBC: 10.5 10*3/uL (ref 4.0–10.5)

## 2017-08-19 LAB — TYPE AND SCREEN
ABO/RH(D): AB POS
Antibody Screen: NEGATIVE

## 2017-08-19 LAB — OB RESULTS CONSOLE GBS: GBS: POSITIVE

## 2017-08-19 LAB — ABO/RH: ABO/RH(D): AB POS

## 2017-08-19 MED ORDER — SODIUM CHLORIDE 0.9 % IV SOLN
510.0000 mg | Freq: Once | INTRAVENOUS | Status: AC
  Administered 2017-08-19: 510 mg via INTRAVENOUS
  Filled 2017-08-19: qty 17

## 2017-08-19 NOTE — Progress Notes (Signed)
Spoke to Dr Pamala Hurry, pt may be d/c.

## 2017-08-19 NOTE — Progress Notes (Signed)
HD # 2  Pt notes no HA, no vision change, no RUQ pain, no scotomata, good FM, no LOF, no VB. Eating well, no N/V, some GERD, resolved with TUMS  PE:  Vitals:   08/19/17 0526 08/19/17 0800 08/19/17 1200 08/19/17 1556  BP:  (!) 149/85 (!) 146/88 (!) 150/82  Pulse:  70 69 74  Resp:  20 18 18   Temp:  98.8 F (37.1 C) 98.7 F (37.1 C) 98.1 F (36.7 C)  TempSrc:  Oral Oral Oral  SpO2:  99% 99% 97%  Weight: 106.7 kg (235 lb 4 oz)     Height:       Gen: well appearing, no distress CV: RRR Pulm: CTAB Abd: soft, gravid, no RUQ pain, no fundal tenderness LE: 3+ DTR. No clonus, no edema GU: def  Toco: none FH:  130s, + accels, no decels, 10 beat var  CBC    Component Value Date/Time   WBC 10.5 08/19/2017 0509   RBC 3.49 (L) 08/19/2017 0509   HGB 10.4 (L) 08/19/2017 0509   HCT 30.3 (L) 08/19/2017 0509   PLT 263 08/19/2017 0509   MCV 86.8 08/19/2017 0509   MCH 29.8 08/19/2017 0509   MCHC 34.3 08/19/2017 0509   RDW 15.1 08/19/2017 0509   LYMPHSABS 2.2 02/17/2014 1800   MONOABS 0.6 02/17/2014 1800   EOSABS 0.2 02/17/2014 1800   BASOSABS 0.0 02/17/2014 1800    CMP Latest Ref Rng & Units 08/19/2017 08/18/2017 02/19/2014  Glucose 65 - 99 mg/dL 114(H) 95 92  BUN 6 - 20 mg/dL 22(H) 26(H) 39(H)  Creatinine 0.44 - 1.00 mg/dL 1.32(H) 1.69(H) 2.46(H)  Sodium 135 - 145 mmol/L 133(L) 133(L) 136(L)  Potassium 3.5 - 5.1 mmol/L 4.5 4.2 4.0  Chloride 101 - 111 mmol/L 104 104 99  CO2 22 - 32 mmol/L 20(L) 21(L) 25  Calcium 8.9 - 10.3 mg/dL 9.3 8.8(L) 9.2  Total Protein 6.5 - 8.1 g/dL 7.1 7.0 -  Total Bilirubin 0.3 - 1.2 mg/dL 0.5 0.5 -  Alkaline Phos 38 - 126 U/L 65 63 -  AST 15 - 41 U/L 12(L) 11(L) -  ALT 14 - 54 U/L 12(L) 12(L) -    A/P: 41 yo G3P2 at 35'0 here with gest htn on top of chronic htn and hypertensive renal disease. Pt has overall had bps under good control on Procardia 90 XL and labetalol 450 tid until seen in office yest. Pt with new onset elevated bp's. Bp's remained  elevated in MAU with new increase in Cr. Consideration for IOL at that time though pt felt bps were high due to prolonged time in MAU, missed med dose, lack of eating. Pt eventually agreed to admit for observation. Overnight and through day today bp's under better and now adequate control. Cr decreased and Hgb increased in am labs. Plts and LFTs stable. D/w pt we have a low threshold for delivery given her multiple medical risks factors- obese, htn, renal disease, AMA and already 35 wks. Pt would like more time before delivery. As her bp have stabilized, will keep pt in house for [place] # 2, complete the 24 hr urine. Pt can be d/c tonight with plans for f/u in office on Tues. I have recommended OOW and bedrest. Will increase fetal monitoring to 2/wk, plan BPP. Pt is checking home bps and will f/u with sbp in 160s and dbp >105. Sx of PEC d/w pt and she will present immediately with any sx.  Anemia. Given risks for PEC/ edema  and risks of bleeidng due to fibroids, recc IV iron while in house.  Tabetha Haraway A. 08/19/2017 5:22 PM

## 2017-08-20 LAB — PROTEIN, URINE, 24 HOUR
COLLECTION INTERVAL-UPROT: 24 h
Protein, 24H Urine: 304 mg/d — ABNORMAL HIGH (ref 50–100)
Protein, Urine: 8 mg/dL
URINE TOTAL VOLUME-UPROT: 3800 mL

## 2017-08-20 LAB — CREATININE CLEARANCE, URINE, 24 HOUR
CREAT CLEAR: 120 mL/min — AB (ref 75–115)
Collection Interval-CRCL: 24 hours
Creatinine, 24H Ur: 2272 mg/d — ABNORMAL HIGH (ref 600–1800)
Creatinine, Urine: 59.79 mg/dL
Urine Total Volume-CRCL: 3800 mL

## 2017-08-20 NOTE — Progress Notes (Signed)
Pt left floor walking in stable condition D/C instructions given to pt, no questions or concerns from pt.

## 2017-08-21 LAB — CULTURE, BETA STREP (GROUP B ONLY)

## 2017-08-28 ENCOUNTER — Other Ambulatory Visit: Payer: Self-pay | Admitting: Obstetrics & Gynecology

## 2017-08-28 ENCOUNTER — Telehealth (HOSPITAL_COMMUNITY): Payer: Self-pay | Admitting: *Deleted

## 2017-08-28 ENCOUNTER — Encounter (HOSPITAL_COMMUNITY): Payer: Self-pay | Admitting: *Deleted

## 2017-08-28 NOTE — Telephone Encounter (Signed)
Preadmission screen  

## 2017-08-30 ENCOUNTER — Telehealth (HOSPITAL_COMMUNITY): Payer: Self-pay | Admitting: *Deleted

## 2017-08-30 NOTE — Telephone Encounter (Signed)
Preadmission screen  

## 2017-09-01 ENCOUNTER — Inpatient Hospital Stay (HOSPITAL_COMMUNITY)
Admission: AD | Admit: 2017-09-01 | Discharge: 2017-09-06 | DRG: 787 | Disposition: A | Discharge status: Home / Self Care | Payer: 59 | Source: Ambulatory Visit | Attending: Obstetrics and Gynecology | Admitting: Obstetrics and Gynecology

## 2017-09-01 ENCOUNTER — Encounter (HOSPITAL_COMMUNITY): Payer: Self-pay | Admitting: *Deleted

## 2017-09-01 DIAGNOSIS — O368390 Maternal care for abnormalities of the fetal heart rate or rhythm, unspecified trimester, not applicable or unspecified: Secondary | ICD-10-CM

## 2017-09-01 DIAGNOSIS — D62 Acute posthemorrhagic anemia: Secondary | ICD-10-CM | POA: Diagnosis not present

## 2017-09-01 DIAGNOSIS — Z3A36 36 weeks gestation of pregnancy: Secondary | ICD-10-CM | POA: Diagnosis not present

## 2017-09-01 DIAGNOSIS — N189 Chronic kidney disease, unspecified: Secondary | ICD-10-CM | POA: Diagnosis present

## 2017-09-01 DIAGNOSIS — O99824 Streptococcus B carrier state complicating childbirth: Secondary | ICD-10-CM | POA: Diagnosis present

## 2017-09-01 DIAGNOSIS — O321XX0 Maternal care for breech presentation, not applicable or unspecified: Secondary | ICD-10-CM | POA: Diagnosis present

## 2017-09-01 DIAGNOSIS — O9081 Anemia of the puerperium: Secondary | ICD-10-CM | POA: Diagnosis not present

## 2017-09-01 DIAGNOSIS — Z3490 Encounter for supervision of normal pregnancy, unspecified, unspecified trimester: Secondary | ICD-10-CM | POA: Diagnosis present

## 2017-09-01 DIAGNOSIS — O3413 Maternal care for benign tumor of corpus uteri, third trimester: Secondary | ICD-10-CM | POA: Diagnosis present

## 2017-09-01 DIAGNOSIS — O10213 Pre-existing hypertensive chronic kidney disease complicating pregnancy, third trimester: Secondary | ICD-10-CM | POA: Diagnosis present

## 2017-09-01 DIAGNOSIS — O26833 Pregnancy related renal disease, third trimester: Secondary | ICD-10-CM | POA: Diagnosis present

## 2017-09-01 DIAGNOSIS — D259 Leiomyoma of uterus, unspecified: Secondary | ICD-10-CM | POA: Diagnosis present

## 2017-09-01 DIAGNOSIS — D509 Iron deficiency anemia, unspecified: Secondary | ICD-10-CM | POA: Diagnosis present

## 2017-09-01 DIAGNOSIS — I129 Hypertensive chronic kidney disease with stage 1 through stage 4 chronic kidney disease, or unspecified chronic kidney disease: Secondary | ICD-10-CM | POA: Diagnosis present

## 2017-09-01 DIAGNOSIS — O1002 Pre-existing essential hypertension complicating childbirth: Secondary | ICD-10-CM | POA: Diagnosis present

## 2017-09-01 DIAGNOSIS — O10919 Unspecified pre-existing hypertension complicating pregnancy, unspecified trimester: Secondary | ICD-10-CM | POA: Diagnosis present

## 2017-09-01 DIAGNOSIS — Z9889 Other specified postprocedural states: Secondary | ICD-10-CM

## 2017-09-01 LAB — TYPE AND SCREEN
ABO/RH(D): AB POS
Antibody Screen: NEGATIVE

## 2017-09-01 LAB — CBC
HEMATOCRIT: 29.7 % — AB (ref 36.0–46.0)
HEMOGLOBIN: 10 g/dL — AB (ref 12.0–15.0)
MCH: 29.7 pg (ref 26.0–34.0)
MCHC: 33.7 g/dL (ref 30.0–36.0)
MCV: 88.1 fL (ref 78.0–100.0)
Platelets: 259 10*3/uL (ref 150–400)
RBC: 3.37 MIL/uL — AB (ref 3.87–5.11)
RDW: 15.6 % — AB (ref 11.5–15.5)
WBC: 10.2 10*3/uL (ref 4.0–10.5)

## 2017-09-01 LAB — COMPREHENSIVE METABOLIC PANEL
ALBUMIN: 3.1 g/dL — AB (ref 3.5–5.0)
ALT: 15 U/L (ref 14–54)
AST: 12 U/L — AB (ref 15–41)
Alkaline Phosphatase: 82 U/L (ref 38–126)
Anion gap: 9 (ref 5–15)
BILIRUBIN TOTAL: 0.2 mg/dL — AB (ref 0.3–1.2)
BUN: 24 mg/dL — AB (ref 6–20)
CHLORIDE: 103 mmol/L (ref 101–111)
CO2: 22 mmol/L (ref 22–32)
CREATININE: 1.19 mg/dL — AB (ref 0.44–1.00)
Calcium: 9.9 mg/dL (ref 8.9–10.3)
GFR calc Af Amer: >60 mL/min (ref >60)
GFR, EST NON AFRICAN AMERICAN: 55 mL/min — AB (ref >60)
Glucose, Bld: 85 mg/dL (ref 65–99)
POTASSIUM: 4.3 mmol/L (ref 3.5–5.1)
Sodium: 134 mmol/L — ABNORMAL LOW (ref 135–145)
TOTAL PROTEIN: 6.6 g/dL (ref 6.5–8.1)

## 2017-09-01 LAB — URIC ACID: Uric Acid, Serum: 7.1 mg/dL — ABNORMAL HIGH (ref 2.3–6.6)

## 2017-09-01 LAB — PROTEIN / CREATININE RATIO, URINE
Creatinine, Urine: 106 mg/dL
Protein Creatinine Ratio: 0.11 mg/mg{Cre} (ref 0.00–0.15)
Total Protein, Urine: 12 mg/dL

## 2017-09-01 MED ORDER — OXYTOCIN 40 UNITS IN LACTATED RINGERS INFUSION - SIMPLE MED
1.0000 m[IU]/min | INTRAVENOUS | Status: DC
  Administered 2017-09-01 – 2017-09-02 (×2): 2 m[IU]/min via INTRAVENOUS

## 2017-09-01 MED ORDER — LACTATED RINGERS IV SOLN
500.0000 mL | INTRAVENOUS | Status: DC | PRN
  Administered 2017-09-02 (×2): 300 mL via INTRAVENOUS
  Administered 2017-09-02: 500 mL via INTRAVENOUS

## 2017-09-01 MED ORDER — HYDRALAZINE HCL 20 MG/ML IJ SOLN
10.0000 mg | Freq: Once | INTRAMUSCULAR | Status: AC
Start: 2017-09-01 — End: 2017-09-01
  Administered 2017-09-01: 10 mg via INTRAVENOUS

## 2017-09-01 MED ORDER — LABETALOL HCL 5 MG/ML IV SOLN
20.0000 mg | INTRAVENOUS | Status: AC | PRN
  Administered 2017-09-01: 20 mg via INTRAVENOUS
  Administered 2017-09-01: 80 mg via INTRAVENOUS
  Administered 2017-09-01: 40 mg via INTRAVENOUS
  Filled 2017-09-01: qty 16
  Filled 2017-09-01: qty 8

## 2017-09-01 MED ORDER — ONDANSETRON HCL 4 MG/2ML IJ SOLN: 4.0000 mg | Freq: Four times a day (QID) | INTRAMUSCULAR | Status: DC | PRN

## 2017-09-01 MED ORDER — HYDRALAZINE HCL 20 MG/ML IJ SOLN
10.0000 mg | Freq: Once | INTRAMUSCULAR | Status: AC | PRN
  Administered 2017-09-01: 10 mg via INTRAVENOUS
  Filled 2017-09-01: qty 1

## 2017-09-01 MED ORDER — PENICILLIN G POT IN DEXTROSE 60000 UNIT/ML IV SOLN
3.0000 10*6.[IU] | INTRAVENOUS | Status: DC
  Filled 2017-09-01 (×4): qty 50

## 2017-09-01 MED ORDER — TERBUTALINE SULFATE 1 MG/ML IJ SOLN
0.2500 mg | Freq: Once | INTRAMUSCULAR | Status: AC | PRN
  Administered 2017-09-02: 0.25 mg via SUBCUTANEOUS

## 2017-09-01 MED ORDER — SOD CITRATE-CITRIC ACID 500-334 MG/5ML PO SOLN
30.0000 mL | ORAL | Status: DC | PRN
  Filled 2017-09-01: qty 15

## 2017-09-01 MED ORDER — LACTATED RINGERS IV SOLN
INTRAVENOUS | Status: DC
  Administered 2017-09-01 – 2017-09-02 (×3): via INTRAVENOUS

## 2017-09-01 MED ORDER — OXYTOCIN 40 UNITS IN LACTATED RINGERS INFUSION - SIMPLE MED: 1.0000 m[IU]/min | INTRAVENOUS | Status: DC

## 2017-09-01 MED ORDER — HYDRALAZINE HCL 20 MG/ML IJ SOLN
INTRAMUSCULAR | Status: AC
  Filled 2017-09-01: qty 1

## 2017-09-01 MED ORDER — FAMOTIDINE IN NACL 20-0.9 MG/50ML-% IV SOLN
20.0000 mg | Freq: Once | INTRAVENOUS | Status: AC
  Administered 2017-09-01: 20 mg via INTRAVENOUS
  Filled 2017-09-01: qty 50

## 2017-09-01 MED ORDER — LABETALOL HCL 200 MG PO TABS
450.0000 mg | ORAL_TABLET | Freq: Three times a day (TID) | ORAL | Status: DC
  Administered 2017-09-01 – 2017-09-02 (×3): 450 mg via ORAL
  Filled 2017-09-01: qty 0.5
  Filled 2017-09-01: qty 1
  Filled 2017-09-01 (×3): qty 0.5

## 2017-09-01 MED ORDER — OXYTOCIN 40 UNITS IN LACTATED RINGERS INFUSION - SIMPLE MED
INTRAVENOUS | Status: AC
  Filled 2017-09-01: qty 1000

## 2017-09-01 MED ORDER — LABETALOL HCL 5 MG/ML IV SOLN
INTRAVENOUS | Status: AC
  Filled 2017-09-01: qty 4

## 2017-09-01 MED ORDER — TERBUTALINE SULFATE 1 MG/ML IJ SOLN
0.2500 mg | Freq: Once | INTRAMUSCULAR | Status: AC | PRN
  Administered 2017-09-02: 0.25 mg via SUBCUTANEOUS
  Filled 2017-09-01 (×2): qty 1

## 2017-09-01 MED ORDER — PENICILLIN G POTASSIUM 5000000 UNITS IJ SOLR
5.0000 10*6.[IU] | Freq: Once | INTRAVENOUS | Status: DC
  Filled 2017-09-01: qty 5

## 2017-09-01 MED ORDER — MAGNESIUM SULFATE 40 G IN LACTATED RINGERS - SIMPLE
2.0000 g/h | INTRAVENOUS | Status: DC
  Administered 2017-09-01: 2 g/h via INTRAVENOUS
  Filled 2017-09-01: qty 40

## 2017-09-01 MED ORDER — ACETAMINOPHEN 325 MG PO TABS: 650.0000 mg | ORAL_TABLET | ORAL | Status: DC | PRN

## 2017-09-01 NOTE — H&P (Addendum)
[name] is a 41 y.o. female [phone] at 63.6 wga presenting for IOL d/t h/o chronic htn with worsening disease, sent from office.  Patient presents for IOL, denies any h/a, vision changes, RUQ pain.  She denies any vaginal bleeding, lof, and does noted +FM.  She also feels occ mild contractions.  She has been followed with growth u/s and antenatal testing - most recent efw 3 days ago and efw 52%, 6lb1oz; bpp today 8/8, nml afi and cephalic (breech 3 days ago).  She also has a fibroid uterus with largest anterior and about 6cm.   PNCare at [facility] since 10 wks History OB History    Gravida Para Term Preterm AB Living   4 2 2   1 2    SAB TAB Ectopic Multiple Live Births   1       2     Past Medical History:  Diagnosis Date  . AMA (advanced maternal age) multigravida 32+   . Chronic kidney disease    sees kidney clinic  . Fibroid   . Headache(784.0)   . Hypertension    Past Surgical History:  Procedure Laterality Date  . LEEP    . NO PAST SURGERIES    . WISDOM TOOTH EXTRACTION Bilateral    Family History: family history includes Hypertension in her father and paternal grandmother. Social History:  reports that she has never smoked. She has never used smokeless tobacco. She reports that she does not drink alcohol or use drugs.  ROS  Dilation: 2.5 Effacement (%): Thick Station: Ballotable Exam by:: S Nix RN Blood pressure (!) 152/98, pulse 67, temperature 98.5 F (36.9 C), temperature source Oral, resp. rate 18.   Patient Vitals for the past 24 hrs:  BP Temp Temp src Pulse Resp  09/01/17 2211 (!) 152/98 - - 67 -  09/01/17 2130 (!) 161/90 - - 74 18  09/01/17 2120 (!) 160/90 - - 76 18  09/01/17 2110 (!) 166/95 - - 74 18  09/01/17 2100 (!) 159/95 - - 68 18  09/01/17 2050 (!) 168/99 - - 69 18  09/01/17 2044 (!) 159/95 - - 66 18  09/01/17 2030 (!) 163/98 - - 67 18  09/01/17 2020 (!) 169/101 - - 66 18  09/01/17 2013 (!) 165/98 - - 63 18  09/01/17 2000 (!)  174/102 - - 65 18  09/01/17 1954 (!) 152/97 - - 63 18  09/01/17 1944 (!) 173/108 - - 66 18  09/01/17 1932 (!) 167/99 - - 66 18  09/01/17 1926 (!) 160/97 - - 66 18  09/01/17 1909 (!) 164/94 - - 64 16  09/01/17 1859 (!) 163/103 - - 65 16  09/01/17 1840 (!) 178/107 98.5 F (36.9 C) Oral 66 16    Exam Physical Exam  Prenatal labs: ABO, Rh: --/--/AB POS (10/05 1850) Antibody: NEG (10/05 1850) Rubella: Immune (04/03 0000) RPR: Nonreactive (04/03 0000)  HBsAg: Negative (04/03 0000)  HIV: Non-reactive (04/03 0000)  GBS:   positive 1 hr Glucola - passed Genetic screening - negative Anatomy US - normal, fibroids  Physical Exam:  A&O x 3 HEENT - grossly wnl Lungs : ctab CV : rrr Abdo : soft, nt, gravid; efw 6lbs Extr : +1 edema, nt bilat LE; 2+ dtr Pelvic : 3/50/-3, soft, mid position to posterior; speculum placed and balloon catheter placed, balloon inflated and placemnt confirmed, as ballooon continued to expand, was displaced and then removed; attempt again and ringed forcep held catheter in place  during inflation of balloon but again was displaced - pt tolerated procedure but no further attempts to place foley; initially pt was not tolerating cx exam well and appeared closer to 2cm dilated, but she then relaxed and cx noted to be above (3cm); procedure abandoned and speculum removed  FHT: 120s-130s, normal variability, occasional variable to 90s (couple) or more commonly 110-120s - occasional and resolve; occasional variable/?early decel but again only a couple and drop in baseline by about 10bmp; +accels TOCO: irreg ctx, q 2, mild and pt doesn't feel  CBC    Component Value Date/Time   WBC 10.2 09/01/2017 1850   RBC 3.37 (L) 09/01/2017 1850   HGB 10.0 (L) 09/01/2017 1850   HCT 29.7 (L) 09/01/2017 1850   PLT 259 09/01/2017 1850   MCV 88.1 09/01/2017 1850   MCH 29.7 09/01/2017 1850   MCHC 33.7 09/01/2017 1850   RDW 15.6 (H) 09/01/2017 1850   LYMPHSABS 2.2 02/17/2014 1800    MONOABS 0.6 02/17/2014 1800   EOSABS 0.2 02/17/2014 1800   BASOSABS 0.0 02/17/2014 1800   CMP Latest Ref Rng & Units 09/01/2017 08/19/2017 08/18/2017  Glucose 65 - 99 mg/dL 85 114(H) 95  BUN 6 - 20 mg/dL 24(H) 22(H) 26(H)  Creatinine 0.44 - 1.00 mg/dL 1.19(H) 1.32(H) 1.69(H)  Sodium 135 - 145 mmol/L 134(L) 133(L) 133(L)  Potassium 3.5 - 5.1 mmol/L 4.3 4.5 4.2  Chloride 101 - 111 mmol/L 103 104 104  CO2 22 - 32 mmol/L 22 20(L) 21(L)  Calcium 8.9 - 10.3 mg/dL 9.9 9.3 8.8(L)  Total Protein 6.5 - 8.1 g/dL 6.6 7.1 7.0  Total Bilirubin 0.3 - 1.2 mg/dL 0.2(L) 0.5 0.5  Alkaline Phos 38 - 126 U/L 82 65 63  AST 15 - 41 U/L 12(L) 12(L) 11(L)  ALT 14 - 54 U/L 15 12(L) 12(L)    Pr/cr ratio: .11  Assessment/Plan:  iup at 36.6 wga with chtn with worsening disease 1. Admit for IOL: on admission pt noted to have severe range bps and given 20mg , 40, 80 mg labetalol and continued severe range, then given hydralazine 10mg  x2 doses total - bps then noted mild range; labs reviewed and elevated creatinine but lower/improved from prior levels, lfts wnl, plts wnl and mild anemia; magnesium sulfate started for seizure prophylaxis but no bolus d/t creatinine; plan for strict I/o's and mag level q 6 hrs; q 1-2 hr mag checks through duration of treatment; pt asymptomatic and will follow closely; plan for pitocin IOL and plan for svd; will plan pih labs q 6 hrs 2. Anemia  3. Chronic renal dz - stable with pregnancy, baseline proteinuria about 300mg  (24 hr urine)  4. Fetal status reassuring - contin efm 5. gbs pos - pcn in active labor 6. Fibroid uterus 7. Ama: neg genetic screening 8. Rh pos, rubella immune  Charyl Bigger 09/01/2017, 10:22 PM

## 2017-09-01 NOTE — Progress Notes (Signed)
   09/01/17 2330 09/01/17 2340  Vital Signs  BP (!) 163/101 (!) 161/99  Pulse Rate 75 76  Resp 18 18   Pt received 450mg  PO labetalol at 2312.

## 2017-09-01 NOTE — Progress Notes (Signed)
   09/01/17 2100  Vital Signs  BP (!) 159/95  Pulse Rate 68  Resp 18   Pt given 10mg  IV hydralazine as one time order from MD. Pt to remain in bed, and use bedpan as needed until further notice

## 2017-09-01 NOTE — Progress Notes (Signed)
   09/01/17 2000 09/01/17 2013  Vital Signs  BP (!) 174/102 (!) 165/98  Pulse Rate 65 63  Resp 65 18   MD called RN for an update at 2000. Pt given 80mg  IV labetalol at 2007.

## 2017-09-01 NOTE — Progress Notes (Addendum)
   09/01/17 2020  Vital Signs  BP (!) 169/101  Pulse Rate 66  Resp 18   Pt given 10mg  IV hydralazine at 2023, MD in unit

## 2017-09-01 NOTE — Anesthesia Pain Management Evaluation Note (Signed)
  CRNA Pain Management Visit Note  Patient: [name], 41 y.o., female  "Hello I am a member of the anesthesia team at [facility]. We have an anesthesia team available at all times to provide care throughout the hospital, including epidural management and anesthesia for C-section. I don't know your plan for the delivery whether it a natural birth, water birth, IV sedation, nitrous supplementation, doula or epidural, but we want to meet your pain goals."   1.Was your pain managed to your expectations on prior hospitalizations?   Unable to assess - per RN request  2.What is your expectation for pain management during this hospitalization?    Nursing interventioon   3.How can we help you reach that goal? Nursing intervention  Record the patient's initial score and the patient's pain goal.   Pain: 10/10  Pain Goal: 0/10 [facility] wants you to be able to say your pain was always managed very well.  Ailene Ards 09/01/2017

## 2017-09-01 NOTE — Progress Notes (Addendum)
   09/01/17 1859 09/01/17 1909 09/01/17 1926  Vital Signs  BP (!) 163/103 (!) 164/94 (!) 160/97  Pulse Rate 65 64 66  Resp 16 16 18    RN called MD, hypertension protocol initiated; 20mg  IV labetalol given at [employer]

## 2017-09-01 NOTE — Progress Notes (Signed)
   09/01/17 1944  Vital Signs  BP (!) 173/108  Pulse Rate 66  Resp 18   Pt up to bathroom at 1940, BP retaken at 1944; 40mg  IV labetalol given at 1947

## 2017-09-02 ENCOUNTER — Inpatient Hospital Stay (HOSPITAL_COMMUNITY): Payer: 59 | Admitting: Anesthesiology

## 2017-09-02 ENCOUNTER — Encounter (HOSPITAL_COMMUNITY): Payer: Self-pay | Admitting: *Deleted

## 2017-09-02 ENCOUNTER — Encounter (HOSPITAL_COMMUNITY)
Admission: AD | Disposition: A | Discharge status: Home / Self Care | Payer: Self-pay | Source: Ambulatory Visit | Attending: Obstetrics and Gynecology

## 2017-09-02 DIAGNOSIS — O10919 Unspecified pre-existing hypertension complicating pregnancy, unspecified trimester: Secondary | ICD-10-CM | POA: Diagnosis present

## 2017-09-02 HISTORY — PX: MYOMECTOMY: SHX85

## 2017-09-02 LAB — COMPREHENSIVE METABOLIC PANEL
ALT: 14 U/L (ref 14–54)
ANION GAP: 12 (ref 5–15)
AST: 13 U/L — ABNORMAL LOW (ref 15–41)
Albumin: 3 g/dL — ABNORMAL LOW (ref 3.5–5.0)
Alkaline Phosphatase: 83 U/L (ref 38–126)
BILIRUBIN TOTAL: 0.6 mg/dL (ref 0.3–1.2)
BUN: 17 mg/dL (ref 6–20)
CHLORIDE: 102 mmol/L (ref 101–111)
CO2: 20 mmol/L — ABNORMAL LOW (ref 22–32)
Calcium: 9.3 mg/dL (ref 8.9–10.3)
Creatinine, Ser: 1.12 mg/dL — ABNORMAL HIGH (ref 0.44–1.00)
GFR, EST NON AFRICAN AMERICAN: 60 mL/min — AB (ref >60)
Glucose, Bld: 94 mg/dL (ref 65–99)
POTASSIUM: 4.1 mmol/L (ref 3.5–5.1)
Sodium: 134 mmol/L — ABNORMAL LOW (ref 135–145)
TOTAL PROTEIN: 6.4 g/dL — AB (ref 6.5–8.1)

## 2017-09-02 LAB — CBC
HEMATOCRIT: 31.7 % — AB (ref 36.0–46.0)
HEMATOCRIT: 28.9 % — AB (ref 36.0–46.0)
HEMOGLOBIN: 9.8 g/dL — AB (ref 12.0–15.0)
Hemoglobin: 10.7 g/dL — ABNORMAL LOW (ref 12.0–15.0)
MCH: 29.4 pg (ref 26.0–34.0)
MCH: 29.6 pg (ref 26.0–34.0)
MCHC: 33.8 g/dL (ref 30.0–36.0)
MCHC: 33.9 g/dL (ref 30.0–36.0)
MCV: 87.1 fL (ref 78.0–100.0)
MCV: 87.3 fL (ref 78.0–100.0)
PLATELETS: 247 10*3/uL (ref 150–400)
PLATELETS: 221 10*3/uL (ref 150–400)
RBC: 3.64 MIL/uL — ABNORMAL LOW (ref 3.87–5.11)
RBC: 3.31 MIL/uL — AB (ref 3.87–5.11)
RDW: 15.5 % (ref 11.5–15.5)
RDW: 15.4 % (ref 11.5–15.5)
WBC: 11.7 10*3/uL — ABNORMAL HIGH (ref 4.0–10.5)
WBC: 13 10*3/uL — ABNORMAL HIGH (ref 4.0–10.5)

## 2017-09-02 LAB — RPR: RPR Ser Ql: NONREACTIVE

## 2017-09-02 LAB — URIC ACID: URIC ACID, SERUM: 7 mg/dL — AB (ref 2.3–6.6)

## 2017-09-02 LAB — MAGNESIUM: Magnesium: 4.1 mg/dL — ABNORMAL HIGH (ref 1.7–2.4)

## 2017-09-02 SURGERY — Surgical Case
Anesthesia: Epidural | Site: Uterus

## 2017-09-02 MED ORDER — MORPHINE SULFATE (PF) 0.5 MG/ML IJ SOLN
INTRAMUSCULAR | Status: AC
  Filled 2017-09-02: qty 10

## 2017-09-02 MED ORDER — HYDROMORPHONE HCL 1 MG/ML IJ SOLN
0.5000 mg | Freq: Once | INTRAMUSCULAR | Status: AC
  Administered 2017-09-02: 0.5 mg via INTRAVENOUS

## 2017-09-02 MED ORDER — LACTATED RINGERS IV SOLN: 500.0000 mL | Freq: Once | INTRAVENOUS | Status: DC

## 2017-09-02 MED ORDER — FENTANYL 2.5 MCG/ML BUPIVACAINE 1/10 % EPIDURAL INFUSION (WH - ANES)
14.0000 mL/h | INTRAMUSCULAR | Status: DC | PRN
  Administered 2017-09-02: 14 mL/h via EPIDURAL
  Filled 2017-09-02: qty 100

## 2017-09-02 MED ORDER — PHENYLEPHRINE HCL 10 MG/ML IJ SOLN
INTRAMUSCULAR | Status: DC | PRN
  Administered 2017-09-02: 80 ug via INTRAVENOUS
  Administered 2017-09-02: 40 ug via INTRAVENOUS
  Administered 2017-09-02: 80 ug via INTRAVENOUS
  Administered 2017-09-02: 40 ug via INTRAVENOUS
  Administered 2017-09-02: 80 ug via INTRAVENOUS
  Administered 2017-09-02 (×3): 40 ug via INTRAVENOUS
  Administered 2017-09-02 (×2): 80 ug via INTRAVENOUS
  Administered 2017-09-02: 40 ug via INTRAVENOUS
  Administered 2017-09-02: 80 ug via INTRAVENOUS
  Administered 2017-09-02: 40 ug via INTRAVENOUS
  Administered 2017-09-02 (×2): 80 ug via INTRAVENOUS
  Administered 2017-09-02 (×4): 40 ug via INTRAVENOUS

## 2017-09-02 MED ORDER — IBUPROFEN 600 MG PO TABS
600.0000 mg | ORAL_TABLET | Freq: Four times a day (QID) | ORAL | Status: DC
  Administered 2017-09-02 – 2017-09-04 (×9): 600 mg via ORAL
  Filled 2017-09-02 (×10): qty 1

## 2017-09-02 MED ORDER — OXYTOCIN 10 UNIT/ML IJ SOLN
INTRAVENOUS | Status: DC | PRN
  Administered 2017-09-02: 40 [IU] via INTRAVENOUS

## 2017-09-02 MED ORDER — OXYCODONE-ACETAMINOPHEN 5-325 MG PO TABS
2.0000 | ORAL_TABLET | ORAL | Status: DC | PRN
Start: 2017-09-02 — End: 2017-09-06

## 2017-09-02 MED ORDER — DIPHENHYDRAMINE HCL 50 MG/ML IJ SOLN: 12.5000 mg | INTRAMUSCULAR | Status: DC | PRN

## 2017-09-02 MED ORDER — NALBUPHINE HCL 10 MG/ML IJ SOLN: 5.0000 mg | INTRAMUSCULAR | Status: DC | PRN

## 2017-09-02 MED ORDER — SODIUM CHLORIDE 0.9 % IR SOLN
Status: DC | PRN
  Administered 2017-09-02: 1

## 2017-09-02 MED ORDER — COCONUT OIL OIL: 1.0000 "application " | TOPICAL_OIL | Status: DC | PRN

## 2017-09-02 MED ORDER — MENTHOL 3 MG MT LOZG: 1.0000 | LOZENGE | OROMUCOSAL | Status: DC | PRN

## 2017-09-02 MED ORDER — SOD CITRATE-CITRIC ACID 500-334 MG/5ML PO SOLN
30.0000 mL | ORAL | Status: AC
  Administered 2017-09-02: 30 mL via ORAL

## 2017-09-02 MED ORDER — FENTANYL CITRATE (PF) 100 MCG/2ML IJ SOLN
INTRAMUSCULAR | Status: AC
  Administered 2017-09-02: 50 ug via INTRAVENOUS
  Filled 2017-09-02: qty 2

## 2017-09-02 MED ORDER — HYDROMORPHONE HCL 1 MG/ML IJ SOLN
INTRAMUSCULAR | Status: AC
  Administered 2017-09-02: 0.5 mg via INTRAVENOUS
  Filled 2017-09-02: qty 0.5

## 2017-09-02 MED ORDER — PHENYLEPHRINE 40 MCG/ML (10ML) SYRINGE FOR IV PUSH (FOR BLOOD PRESSURE SUPPORT)
80.0000 ug | PREFILLED_SYRINGE | INTRAVENOUS | Status: DC | PRN
Start: 2017-09-02 — End: 2017-09-02
  Administered 2017-09-02: 80 ug via INTRAVENOUS

## 2017-09-02 MED ORDER — MORPHINE SULFATE (PF) 0.5 MG/ML IJ SOLN
INTRAMUSCULAR | Status: DC | PRN
  Administered 2017-09-02: 3 mg via EPIDURAL

## 2017-09-02 MED ORDER — DIBUCAINE 1 % RE OINT: 1.0000 "application " | TOPICAL_OINTMENT | RECTAL | Status: DC | PRN

## 2017-09-02 MED ORDER — DEXAMETHASONE SODIUM PHOSPHATE 10 MG/ML IJ SOLN
INTRAMUSCULAR | Status: AC
  Filled 2017-09-02: qty 1

## 2017-09-02 MED ORDER — NIFEDIPINE ER OSMOTIC RELEASE 30 MG PO TB24
90.0000 mg | ORAL_TABLET | Freq: Every day | ORAL | Status: DC
  Administered 2017-09-02: 90 mg via ORAL
  Filled 2017-09-02: qty 3

## 2017-09-02 MED ORDER — LACTATED RINGERS IV SOLN
INTRAVENOUS | Status: DC | PRN
  Administered 2017-09-02: 09:00:00 via INTRAVENOUS

## 2017-09-02 MED ORDER — GLYCOPYRROLATE 0.2 MG/ML IJ SOLN
INTRAMUSCULAR | Status: AC
  Filled 2017-09-02: qty 1

## 2017-09-02 MED ORDER — EPHEDRINE 5 MG/ML INJ: 10.0000 mg | INTRAVENOUS | Status: DC | PRN

## 2017-09-02 MED ORDER — OXYCODONE-ACETAMINOPHEN 5-325 MG PO TABS: 1.0000 | ORAL_TABLET | ORAL | Status: DC | PRN

## 2017-09-02 MED ORDER — SIMETHICONE 80 MG PO CHEW
80.0000 mg | CHEWABLE_TABLET | Freq: Three times a day (TID) | ORAL | Status: DC
  Administered 2017-09-03 – 2017-09-06 (×10): 80 mg via ORAL
  Filled 2017-09-02 (×10): qty 1

## 2017-09-02 MED ORDER — ALBUMIN HUMAN 5 % IV SOLN
INTRAVENOUS | Status: DC | PRN
  Administered 2017-09-02: 09:00:00 via INTRAVENOUS

## 2017-09-02 MED ORDER — DIPHENHYDRAMINE HCL 25 MG PO CAPS: 25.0000 mg | ORAL_CAPSULE | ORAL | Status: DC | PRN

## 2017-09-02 MED ORDER — PHENYLEPHRINE 40 MCG/ML (10ML) SYRINGE FOR IV PUSH (FOR BLOOD PRESSURE SUPPORT)
PREFILLED_SYRINGE | INTRAVENOUS | Status: AC
  Filled 2017-09-02: qty 10

## 2017-09-02 MED ORDER — CEFAZOLIN SODIUM-DEXTROSE 2-4 GM/100ML-% IV SOLN
2.0000 g | INTRAVENOUS | Status: AC
  Administered 2017-09-02: 2 g via INTRAVENOUS

## 2017-09-02 MED ORDER — SCOPOLAMINE 1 MG/3DAYS TD PT72
MEDICATED_PATCH | TRANSDERMAL | Status: DC | PRN
  Administered 2017-09-02: 1 via TRANSDERMAL

## 2017-09-02 MED ORDER — ONDANSETRON HCL 4 MG/2ML IJ SOLN
INTRAMUSCULAR | Status: DC | PRN
  Administered 2017-09-02: 4 mg via INTRAVENOUS

## 2017-09-02 MED ORDER — OXYTOCIN 40 UNITS IN LACTATED RINGERS INFUSION - SIMPLE MED: 2.5000 [IU]/h | INTRAVENOUS | Status: AC

## 2017-09-02 MED ORDER — PHENYLEPHRINE 40 MCG/ML (10ML) SYRINGE FOR IV PUSH (FOR BLOOD PRESSURE SUPPORT)
80.0000 ug | PREFILLED_SYRINGE | INTRAVENOUS | Status: DC | PRN
  Filled 2017-09-02: qty 10

## 2017-09-02 MED ORDER — FENTANYL CITRATE (PF) 100 MCG/2ML IJ SOLN
25.0000 ug | INTRAMUSCULAR | Status: DC | PRN
  Administered 2017-09-02 (×2): 50 ug via INTRAVENOUS

## 2017-09-02 MED ORDER — SIMETHICONE 80 MG PO CHEW: 80.0000 mg | CHEWABLE_TABLET | ORAL | Status: DC | PRN

## 2017-09-02 MED ORDER — NALBUPHINE HCL 10 MG/ML IJ SOLN: 5.0000 mg | Freq: Once | INTRAMUSCULAR | Status: DC | PRN

## 2017-09-02 MED ORDER — NALOXONE HCL 0.4 MG/ML IJ SOLN: 0.4000 mg | INTRAMUSCULAR | Status: DC | PRN

## 2017-09-02 MED ORDER — MEPERIDINE HCL 25 MG/ML IJ SOLN: 6.2500 mg | INTRAMUSCULAR | Status: DC | PRN

## 2017-09-02 MED ORDER — PHENYLEPHRINE 40 MCG/ML (10ML) SYRINGE FOR IV PUSH (FOR BLOOD PRESSURE SUPPORT)
PREFILLED_SYRINGE | INTRAVENOUS | Status: AC
Start: 2017-09-02 — End: 2017-09-02
  Filled 2017-09-02: qty 10

## 2017-09-02 MED ORDER — DEXAMETHASONE SODIUM PHOSPHATE 10 MG/ML IJ SOLN
INTRAMUSCULAR | Status: DC | PRN
  Administered 2017-09-02: 5 mg via INTRAVENOUS

## 2017-09-02 MED ORDER — LACTATED RINGERS IV SOLN
INTRAVENOUS | Status: DC
  Administered 2017-09-02: 20:00:00 via INTRAVENOUS

## 2017-09-02 MED ORDER — WITCH HAZEL-GLYCERIN EX PADS: 1.0000 "application " | MEDICATED_PAD | CUTANEOUS | Status: DC | PRN

## 2017-09-02 MED ORDER — SENNOSIDES-DOCUSATE SODIUM 8.6-50 MG PO TABS
2.0000 | ORAL_TABLET | ORAL | Status: DC
  Administered 2017-09-02 – 2017-09-04 (×3): 2 via ORAL
  Filled 2017-09-02 (×3): qty 2

## 2017-09-02 MED ORDER — SCOPOLAMINE 1 MG/3DAYS TD PT72
MEDICATED_PATCH | TRANSDERMAL | Status: AC
  Filled 2017-09-02: qty 1

## 2017-09-02 MED ORDER — DIPHENHYDRAMINE HCL 25 MG PO CAPS: 25.0000 mg | ORAL_CAPSULE | Freq: Four times a day (QID) | ORAL | Status: DC | PRN

## 2017-09-02 MED ORDER — NALOXONE HCL 2 MG/2ML IJ SOSY
1.0000 ug/kg/h | PREFILLED_SYRINGE | INTRAVENOUS | Status: DC | PRN
  Filled 2017-09-02: qty 2

## 2017-09-02 MED ORDER — METOCLOPRAMIDE HCL 5 MG/ML IJ SOLN: 10.0000 mg | Freq: Once | INTRAMUSCULAR | Status: DC | PRN

## 2017-09-02 MED ORDER — LIDOCAINE HCL (PF) 1 % IJ SOLN
INTRAMUSCULAR | Status: DC | PRN
  Administered 2017-09-02 (×2): 5 mL via EPIDURAL

## 2017-09-02 MED ORDER — ONDANSETRON HCL 4 MG/2ML IJ SOLN
INTRAMUSCULAR | Status: AC
  Filled 2017-09-02: qty 2

## 2017-09-02 MED ORDER — SODIUM CHLORIDE 0.9% FLUSH: 3.0000 mL | INTRAVENOUS | Status: DC | PRN

## 2017-09-02 MED ORDER — LACTATED RINGERS IV SOLN
INTRAVENOUS | Status: DC | PRN
  Administered 2017-09-02: 08:00:00 via INTRAVENOUS

## 2017-09-02 MED ORDER — SIMETHICONE 80 MG PO CHEW
80.0000 mg | CHEWABLE_TABLET | ORAL | Status: DC
  Administered 2017-09-02 – 2017-09-05 (×4): 80 mg via ORAL
  Filled 2017-09-02 (×4): qty 1

## 2017-09-02 MED ORDER — SODIUM BICARBONATE 8.4 % IV SOLN
INTRAVENOUS | Status: DC | PRN
  Administered 2017-09-02 (×2): 5 mL via EPIDURAL

## 2017-09-02 MED ORDER — OXYTOCIN 10 UNIT/ML IJ SOLN
INTRAMUSCULAR | Status: AC
  Filled 2017-09-02: qty 4

## 2017-09-02 MED ORDER — PRENATAL MULTIVITAMIN CH
1.0000 | ORAL_TABLET | Freq: Every day | ORAL | Status: DC
  Administered 2017-09-03 – 2017-09-05 (×3): 1 via ORAL
  Filled 2017-09-02 (×3): qty 1

## 2017-09-02 MED ORDER — ACETAMINOPHEN 325 MG PO TABS: 650.0000 mg | ORAL_TABLET | ORAL | Status: DC | PRN

## 2017-09-02 MED ORDER — ONDANSETRON HCL 4 MG/2ML IJ SOLN
4.0000 mg | Freq: Three times a day (TID) | INTRAMUSCULAR | Status: DC | PRN
  Administered 2017-09-02: 4 mg via INTRAVENOUS
  Filled 2017-09-02: qty 2

## 2017-09-02 SURGICAL SUPPLY — 42 items
APL SKNCLS STERI-STRIP NONHPOA (GAUZE/BANDAGES/DRESSINGS) ×2
BENZOIN TINCTURE PRP APPL 2/3 (GAUZE/BANDAGES/DRESSINGS) ×4 IMPLANT
CHLORAPREP W/TINT 26ML (MISCELLANEOUS) ×4 IMPLANT
CLAMP CORD UMBIL (MISCELLANEOUS) IMPLANT
CLOSURE WOUND 1/2 X4 (GAUZE/BANDAGES/DRESSINGS) ×1
CLOTH BEACON ORANGE TIMEOUT ST (SAFETY) ×4 IMPLANT
DRSG OPSITE POSTOP 4X10 (GAUZE/BANDAGES/DRESSINGS) ×4 IMPLANT
ELECT REM PT RETURN 9FT ADLT (ELECTROSURGICAL) ×4
ELECTRODE REM PT RTRN 9FT ADLT (ELECTROSURGICAL) ×2 IMPLANT
EXTRACTOR VACUUM KIWI (MISCELLANEOUS) ×4 IMPLANT
GLOVE BIOGEL PI IND STRL 6.5 (GLOVE) ×2 IMPLANT
GLOVE BIOGEL PI IND STRL 7.0 (GLOVE) ×4 IMPLANT
GLOVE BIOGEL PI INDICATOR 6.5 (GLOVE) ×2
GLOVE BIOGEL PI INDICATOR 7.0 (GLOVE) ×4
GLOVE ECLIPSE 6.5 STRL STRAW (GLOVE) ×4 IMPLANT
GOWN STRL REUS W/TWL LRG LVL3 (GOWN DISPOSABLE) ×8 IMPLANT
HEMOSTAT SURGICEL 4X8 (HEMOSTASIS) ×2 IMPLANT
KIT ABG SYR 3ML LUER SLIP (SYRINGE) IMPLANT
NDL HYPO 25X5/8 SAFETYGLIDE (NEEDLE) IMPLANT
NEEDLE HYPO 25X5/8 SAFETYGLIDE (NEEDLE) IMPLANT
NS IRRIG 1000ML POUR BTL (IV SOLUTION) ×4 IMPLANT
PACK C SECTION WH (CUSTOM PROCEDURE TRAY) ×4 IMPLANT
PAD OB MATERNITY 4.3X12.25 (PERSONAL CARE ITEMS) ×4 IMPLANT
PENCIL SMOKE EVAC W/HOLSTER (ELECTROSURGICAL) ×4 IMPLANT
RTRCTR C-SECT PINK 25CM LRG (MISCELLANEOUS) ×2 IMPLANT
SPONGE LAP 18X18 X RAY DECT (DISPOSABLE) ×8 IMPLANT
STRIP CLOSURE SKIN 1/2X4 (GAUZE/BANDAGES/DRESSINGS) ×1 IMPLANT
SUT MNCRL 0 VIOLET CTX 36 (SUTURE) IMPLANT
SUT MON AB-0 CT1 36 (SUTURE) ×10 IMPLANT
SUT MONOCRYL 0 CTX 36 (SUTURE) ×4
SUT PDS AB 0 CT1 36 (SUTURE) IMPLANT
SUT PLAIN 2 0 XLH (SUTURE) ×4 IMPLANT
SUT VIC AB 0 CT1 27 (SUTURE) ×4
SUT VIC AB 0 CT1 27XBRD ANBCTR (SUTURE) IMPLANT
SUT VIC AB 0 CT1 36 (SUTURE) IMPLANT
SUT VIC AB 3-0 CT1 27 (SUTURE) ×4
SUT VIC AB 3-0 CT1 TAPERPNT 27 (SUTURE) IMPLANT
SUT VIC AB 3-0 SH 27 (SUTURE) ×4
SUT VIC AB 3-0 SH 27X BRD (SUTURE) IMPLANT
SUT VIC AB 4-0 PS2 27 (SUTURE) ×4 IMPLANT
TOWEL OR 17X24 6PK STRL BLUE (TOWEL DISPOSABLE) ×4 IMPLANT
TRAY FOLEY BAG SILVER LF 14FR (SET/KITS/TRAYS/PACK) IMPLANT

## 2017-09-02 NOTE — Addendum Note (Signed)
Addendum  created 09/02/17 1422 by Adalberto Ill, CRNA   Sign clinical note

## 2017-09-02 NOTE — Discharge Summary (Signed)
Patient ID: FLOSSIE WEXLER MRN: [id number] DOB/AGE: [birth year] 41 y.o.  Admit date: 08/18/2017 Discharge date: 08/20/17  Admission Diagnoses: 34.6WKS NRNST  Discharge Diagnoses: 34.6WKS NRNST         Discharged Condition: good  Hospital Course: Admitted with elevated bps for further evaluation for preeclampsia with labs, 24 hr urine and continued fetal monitoring. Pt initially had elevated bps, resolved on their own. Chronic kidney disease, elevated Cr. HD #2, labs looked better. Pt received BMZ and after 2 days observation, no criteria for delivery with improved maternal status. She received IV iron for anemia and was sent home with strict precuations.   Consults: None and MFM  Treatments: IV hydration and steroids  Disposition: home   Allergies as of 08/20/2017   No Known Allergies     Medication List    TAKE these medications   calcium carbonate 500 MG chewable tablet Commonly known as:  TUMS - dosed in mg elemental calcium Chew 2 tablets by mouth 3 (three) times daily as needed for indigestion or heartburn.   ferrous sulfate 325 (65 FE) MG tablet Take 325 mg by mouth daily with breakfast.   labetalol 300 MG tablet Commonly known as:  NORMODYNE Take 450 mg by mouth 3 (three) times daily.   NIFEdipine 90 MG 24 hr tablet Commonly known as:  PROCARDIA XL/ADALAT-CC Take 90 mg by mouth daily.   prenatal multivitamin Tabs tablet Take 1 tablet by mouth every evening.        Signed: Ala Dach., MD MD 09/02/2017, 9:32 PM

## 2017-09-02 NOTE — Progress Notes (Signed)
Sign out called to Dr Royce Macadamia. OK to remove epidural based on 0400 platelets. Third floor updated on patient status; magnesium off.

## 2017-09-02 NOTE — Anesthesia Procedure Notes (Signed)
Epidural Patient location during procedure: OB Start time: 09/02/2017 6:05 AM End time: 09/02/2017 6:15 AM  Staffing Anesthesiologist: Taijah Macrae  Preanesthetic Checklist Completed: patient identified, site marked, surgical consent, pre-op evaluation, timeout performed, IV checked, risks and benefits discussed and monitors and equipment checked  Epidural Patient position: sitting Prep: site prepped and draped and DuraPrep Patient monitoring: continuous pulse ox and blood pressure Approach: midline Location: L3-L4 Injection technique: LOR air  Needle:  Needle type: Tuohy  Needle gauge: 17 G Needle length: 9 cm and 9 Needle insertion depth: 8 cm Catheter type: closed end flexible Catheter size: 19 Gauge Catheter at skin depth: 13 cm Test dose: negative  Assessment Events: blood not aspirated, injection not painful, no injection resistance, negative IV test and no paresthesia  Additional Notes 2 attempts.  First at what appears to be L4-5 with osso.  2nd successful at L3-4

## 2017-09-02 NOTE — Progress Notes (Signed)
At 1020, after initial temperature 96 degrees axillary. Bear hugger and warm blankets applied. Room temperature increased. At 1100. Facilities called to increase room temperature. At 1110, Dr Murrell Redden updated on patient's last three blood pressures 140/90's, to continue to hold magnesium. OB updated pt held in PACU for low temperature.

## 2017-09-02 NOTE — Op Note (Signed)
[name]  09/01/2017 - 09/02/2017  Indications: NRFHT, remote from delivery   Pre-operative Diagnosis: nonreactive fetal heart tones; remote from delivery; IUP at 37wga, chtn, chronic renal disease .  Procedure: c-section with "t" incision of uterus;  myomectomy at uterine incision Post-operative Diagnosis: Same ; breech presentation; 5cm fibroid, appears degenerated  Surgeon: Surgeon(s) and Role: Panel 1:    * Jhamal Plucinski, Earlyne Iba, MD - Primary    * Harolyn Rutherford, Sallyanne Havers, MD - Assisting  Panel 2:    * Charyl Bigger, MD - Primary   Assistants: Lars Pinks  Anesthesia: epidural   Procedure Details:  The patient was informed of NRFHT and recommendation for c/s. The risks, benefits, complications, treatment options, and expected outcomes were discussed with the patient. The patient concurred with the proposed plan, giving informed consent. identified as [name] and the procedure verified as C-Section Delivery. A Time Out was held and the above information confirmed.  After induction of anesthesia, the patient was draped and prepped in the usual sterile manner. A transverse was made and carried down through the subcutaneous tissue to the fascia. Fascial incision was made and extended transversely. The fascia was separated from the underlying rectus tissue superiorly and inferiorly. The peritoneum was identified and entered. Peritoneal incision was extended longitudinally. The utero-vesical peritoneal reflection was incised transversely and the bladder flap was bluntly freed from the lower uterine segment. A low transverse uterine incision was made. The infant was noted to no longer be cephalic and hand was presenting at the incision.  Of note, the lower uterine segment was noted to be unusually thick.  Appeared to be breech presentation with hand next to buttocks, attempt to grasp at hips and deliver but unable to bring infant through incision.   This was attempted more than once could not deliver hips.  Decision made to then make small vertical incision on uterus to allow larger area for delivery of infant.  This was difficult d/t very thick uterus but once done, infant then delivered from a breech presentation.  Bulb suctioned on the abdomen, cord then  clamped and cut cord blood was obtained for evaluation. The placenta was delivered spontaneously,  Intact and appeared small.  The uterus was cleared of clot/debris.  The uterus was then examined and a clearly demarcated fibroid, about 5cm, was noted in the portion of the uterine incision, right of the vertical incision.  Half of the fibroid was outside the capsule and appeared to be coming out.  It was white and appeared to be devascularized. The uterus was also noted to be very large and unable to be exteriorized.  The uterine incision was very thick and the fibroid location was difficult to reaproximate uterus. A finger was used to sweep around the fibroid which was easily removed and no bleeding noted from site. The vertical incision was then able to be reaproximated (after removing fibroid) in 3 layers using 0 monocryl.  Good hemostasis was noted of this incision.  The transverse incision was then reapproximated using 3-0 monocryl and an embrocating layer performed.  A figure of eight stitch was then used for hemostasis with 0 monocryl.  Hemostasis was noted of incision.  Unable to visualize adnexa and difficult to palpate d/t size/position of uterus.   There were raw areas of uterus around incision, and a surgicel was placed over the incision area the bladder flap was then reapproximated over the surgicel and incision and excellent  hemostasis was noted - this was done with few inturrupted stitches of 3-0 vicryl.  The pelvis was then cleared of any remaining clots.  The peritoneum was then reapproximated using a running stitch of 3-0 vicryl.  Continued hemostasis was noted. The fascia was then  reapproximated with running sutures of 0Vicryl.  The subcutaneous layer was then irrigated and hemostatic.  The skin was closed with 4-0 vicryl in a subcuticular fashion. Instrument, sponge, and needle counts were correct prior the abdominal closure and were correct at the conclusion of the case.    Findings: viable infant, apgars 7/9; fibroid uterus, enlarged uterus, unusually thickened LUS   Estimated Blood Loss: 1052 ml   Total IV Fluids: 937ml LR, Albumin, 250 ml   Urine Output: 185ml clear urine  Specimens: placenta and fibroid to pathology  Complications: fibroid at uterine incision making delivery of infant difficult; need to make vertical incision of uterus  Disposition: PACU - hemodynamically stable.   Maternal Condition: stable   Baby condition / location:  Couplet care / Skin to Skin  Attending Attestation: I was present and scrubbed for the entire procedure.   Signed: Surgeon(s): Evita Merida, Earlyne Iba, MD Osborne Oman, MD Charyl Bigger, MD

## 2017-09-02 NOTE — Transfer of Care (Signed)
Immediate Anesthesia Transfer of Care Note  Patient: [name]  Procedure(s) Performed: CESAREAN SECTION (N/A ) MYOMECTOMY (N/A Uterus)  Patient Location: PACU  Anesthesia Type:Epidural  Level of Consciousness: awake and alert   Airway & Oxygen Therapy: Patient Spontanous Breathing  Post-op Assessment: Report given to RN and Post -op Vital signs reviewed and stable  Post vital signs: Reviewed and stable  Last Vitals:  Vitals:   09/02/17 0800 09/02/17 0808  BP: (!) 161/95 (!) 144/89  Pulse: 67 67  Resp: 18   Temp:    SpO2:      Last Pain:  Vitals:   09/02/17 0753  TempSrc:   PainSc: 0-No pain         Complications: No apparent anesthesia complications

## 2017-09-02 NOTE — Anesthesia Postprocedure Evaluation (Signed)
Anesthesia Post Note  Patient: [name]  Procedure(s) Performed: CESAREAN SECTION (N/A ) MYOMECTOMY (N/A Uterus)     Patient location during evaluation: [facility] Anesthesia Type: Epidural Level of consciousness: awake, awake and alert and patient cooperative Pain management: pain level controlled Vital Signs Assessment: post-procedure vital signs reviewed and stable Respiratory status: spontaneous breathing Cardiovascular status: blood pressure returned to baseline and stable Postop Assessment: no headache, no apparent nausea or vomiting and adequate PO intake Anesthetic complications: no    Last Vitals:  Vitals:   09/02/17 1221 09/02/17 1327  BP: (!) 138/91 (!) 155/88  Pulse: 67 61  Resp: 18 17  Temp: 36.8 C 36.7 C  SpO2: 95% 95%    Last Pain:  Vitals:   09/02/17 1327  TempSrc: Oral  PainSc: 0-No pain   Pain Goal: Patients Stated Pain Goal: 3 (09/02/17 1327)               Stefani Dama

## 2017-09-02 NOTE — Progress Notes (Signed)
   09/02/17 0625 09/02/17 0627  Vital Signs  BP (!) 105/57 (29mcg phenylephrine given)  Pulse Rate 60 --   Resp 18 --

## 2017-09-02 NOTE — Progress Notes (Addendum)
Comfortable, oxygen mask in place No c/o sob, h/a, vision changes;   BP (!) 147/88   Pulse 65   Temp 97.6 F (36.4 C) (Oral)   Resp 18   Ht 5\' 10"  (1.778 m)   Wt 239 lb (108.4 kg)   SpO2 100%   BMI 34.29 kg/m     A&ox3 nml respirations Abd: soft, nt, gravid LE: no edema, nt bilat  Fht: category 3, recurrent decels; decelerations lasting from 1 min up to 3 min, variability then decreased; variables noted; recurrent decelerations/deep variables even after pitocin stopped and terbutaline given, position changes and oxygen Toco: irreg.   A/p: iup at 37 1. nrfht remote from delivery to or; reviewed fht with patient and recommendation for delivery; reviewed r/b/a of c/s including risk of bleeding, infection, injury to bowel, bladder, nerve blood vessels, risk of further surgery; the patient husband are aware of nrfht and recommendation - agree with plan and consent signed 2. Fetal status non-reassuring and proceed to OR now

## 2017-09-02 NOTE — Progress Notes (Addendum)
Comfortable with epidural, but feeling lots of pressure  Patient Vitals for the past 24 hrs:  BP Temp Temp src Pulse Resp SpO2 Height Weight  09/02/17 0721 (!) 145/82 - - 63 18 100 % - -  09/02/17 0700 127/70 - - 67 - 100 % - -  09/02/17 0640 130/73 - - 64 - 97 % - -  09/02/17 0635 135/65 - - 65 - 100 % - -  09/02/17 0632 125/68 - - 63 - 96 % - -  09/02/17 0625 (!) 105/57 - - 60 18 97 % - -  09/02/17 0620 125/75 - - 62 16 - - -  09/02/17 0616 126/81 - - 63 16 - - -  09/02/17 0611 (!) 150/92 - - 65 16 - - -  09/02/17 0600 (!) 158/90 - - 70 18 - 5\' 10"  (1.778 m) 239 lb (108.4 kg)  09/02/17 0530 (!) 158/93 - - 61 18 - - -  09/02/17 0510 (!) 156/91 - - 62 18 - - -  09/02/17 0500 (!) 165/99 - - 66 18 - - -  09/02/17 0400 (!) 145/95 98.3 F (36.8 C) Oral 68 18 - - -  09/02/17 0300 (!) 142/77 - - 75 18 - - -  09/02/17 0215 138/83 - - 68 18 - - -  09/02/17 0200 136/75 - - 68 18 97 % - -  09/02/17 0145 137/81 - - 69 18 98 % - -  09/02/17 0130 137/84 - - 72 18 97 % - -  09/02/17 0115 136/79 - - 70 18 98 % - -  09/02/17 0110 137/81 - - 70 18 97 % - -  09/02/17 0100 129/72 - - 70 - 97 % - -  09/02/17 0050 130/80 - - 70 18 98 % - -  09/02/17 0040 133/76 - - 71 18 98 % - -  09/02/17 0030 140/87 - - 68 18 - - -  09/02/17 0020 137/84 - - 69 18 - - -  09/02/17 0000 (!) 149/100 - - 72 18 - - -  09/01/17 2350 (!) 150/101 - - 75 18 - - -  09/01/17 2340 (!) 161/99 - - 76 18 - - -  09/01/17 2330 (!) 163/101 - - 75 18 - - -  09/01/17 2320 (!) 160/100 - - 77 - - - -  09/01/17 2310 (!) 153/99 97.8 F (36.6 C) Oral 75 - - - -  09/01/17 2300 (!) 157/100 - - 77 18 - - -  09/01/17 2250 (!) 145/93 - - 74 18 - - -  09/01/17 2240 (!) 158/94 - - 71 18 - - -  09/01/17 2233 (!) 136/97 - - 74 - - - -  09/01/17 2230 135/82 - - 75 18 - - -  09/01/17 2220 (!) 156/91 - - 67 18 - - -  09/01/17 2211 (!) 152/98 - - 67 18 - - -  09/01/17 2130 (!) 161/90 - - 74 18 - - -  09/01/17 2120 (!) 160/90 - - 76 18 - - -   09/01/17 2110 (!) 166/95 - - 74 18 - - -  09/01/17 2100 (!) 159/95 - - 68 18 - - -  09/01/17 2050 (!) 168/99 - - 69 18 - - -  09/01/17 2044 (!) 159/95 - - 66 18 - - -  09/01/17 2030 (!) 163/98 - - 67 18 - - -  09/01/17 2020 (!) 169/101 - - 66 18 - - -  09/01/17 2013 (!) 165/98 - - 63 18 - - -  09/01/17 2000 (!) 174/102 - - 65 18 - - -  09/01/17 1954 (!) 152/97 - - 63 18 - - -  09/01/17 1944 (!) 173/108 - - 66 18 - - -  09/01/17 1932 (!) 167/99 - - 66 18 - - -  09/01/17 1926 (!) 160/97 - - 66 18 - - -  09/01/17 1909 (!) 164/94 - - 64 16 - - -  09/01/17 1859 (!) 163/103 - - 65 16 - - -  09/01/17 1840 (!) 178/107 98.5 F (36.9 C) Oral 66 16 - - -  I/o: 1425 in last 4 hrs  A&ox3 Normal respirations GGY:IRSW, nt gravid Cx: 5-4/62/VOJJ, cephalic but to maternal right pelvis; unable to arom  LE: no edema, nt bilat ; 2+ dtr  FHT: 120s, normal variability but periods of decreased; +accels; decel at 0400 about 1 min and drop; variables - quick and only drop by about 10bpm except 2 deep variables to 90s and 60, not recurrent and resolve; during exam about 3 min decel with nadir at 60s, then back to baseline, decreased variability but then normal variablity Toco: q 2 min  CBC Latest Ref Rng & Units 09/02/2017 09/01/2017 08/19/2017  WBC 4.0 - 10.5 K/uL 11.7(H) 10.2 10.5  Hemoglobin 12.0 - 15.0 g/dL 10.7(L) 10.0(L) 10.4(L)  Hematocrit 36.0 - 46.0 % 31.7(L) 29.7(L) 30.3(L)  Platelets 150 - 400 K/uL 247 259 263   CMP Latest Ref Rng & Units 09/02/2017 09/01/2017 08/19/2017  Glucose 65 - 99 mg/dL 94 85 114(H)  BUN 6 - 20 mg/dL 17 24(H) 22(H)  Creatinine 0.44 - 1.00 mg/dL 1.12(H) 1.19(H) 1.32(H)  Sodium 135 - 145 mmol/L 134(L) 134(L) 133(L)  Potassium 3.5 - 5.1 mmol/L 4.1 4.3 4.5  Chloride 101 - 111 mmol/L 102 103 104  CO2 22 - 32 mmol/L 20(L) 22 20(L)  Calcium 8.9 - 10.3 mg/dL 9.3 9.9 9.3  Total Protein 6.5 - 8.1 g/dL 6.4(L) 6.6 7.1  Total Bilirubin 0.3 - 1.2 mg/dL 0.6 0.2(L) 0.5  Alkaline Phos  38 - 126 U/L 83 82 65  AST 15 - 41 U/L 13(L) 12(L) 12(L)  ALT 14 - 54 U/L 14 15 12(L)   Magnesium 4.7  A/p: iup at 37 , chtn with renal dz 1. Unable to arom d/t position of fetal head and concern for cord prolapse; contin position change and monitor fetal status closely 2. Category 2 3. gbs pos

## 2017-09-02 NOTE — Anesthesia Preprocedure Evaluation (Addendum)
Anesthesia Evaluation  Patient identified by MRN, date of birth, ID band Patient awake    Reviewed: Allergy & Precautions, H&P , NPO status , Patient's Chart, lab work & pertinent test results, reviewed documented beta blocker date and time   Airway Mallampati: II  TM Distance: >3 FB Neck ROM: full    Dental no notable dental hx.    Pulmonary neg pulmonary ROS,    Pulmonary exam normal breath sounds clear to auscultation       Cardiovascular hypertension, On Medications and On Home Beta Blockers negative cardio ROS Normal cardiovascular exam Rhythm:regular Rate:Normal     Neuro/Psych negative neurological ROS  negative psych ROS   GI/Hepatic negative GI ROS, Neg liver ROS,   Endo/Other  negative endocrine ROS  Renal/GU negative Renal ROS  negative genitourinary   Musculoskeletal   Abdominal   Peds  Hematology negative hematology ROS (+)   Anesthesia Other Findings   Reproductive/Obstetrics (+) Pregnancy                             Anesthesia Physical Anesthesia Plan  ASA: III and emergent  Anesthesia Plan: Epidural   Post-op Pain Management:    Induction:   PONV Risk Score and Plan: 4 or greater and Ondansetron, Dexamethasone, Midazolam, Scopolamine patch - Pre-op and Propofol infusion  Airway Management Planned: Natural Airway  Additional Equipment:   Intra-op Plan:   Post-operative Plan:   Informed Consent: I have reviewed the patients History and Physical, chart, labs and discussed the procedure including the risks, benefits and alternatives for the proposed anesthesia with the patient or authorized representative who has indicated his/her understanding and acceptance.   Dental Advisory Given  Plan Discussed with: Anesthesiologist, CRNA and Surgeon  Anesthesia Plan Comments: (Labs checked- platelets confirmed with RN in room. Fetal heart tracing, per RN, reported to be  stable enough for sitting procedure. Discussed epidural, and patient consents to the procedure:  included risk of possible headache,backache, failed block, allergic reaction, and nerve injury. This patient was asked if she had any questions or concerns before the procedure started.  Patient for C/section for fetal intolerance to labor. Will use Epidural for c/section. M. Fizza Scales,MD)       Anesthesia Quick Evaluation

## 2017-09-02 NOTE — Anesthesia Postprocedure Evaluation (Signed)
Anesthesia Post Note  Patient: Michel Santee  Procedure(s) Performed: CESAREAN SECTION (N/A ) MYOMECTOMY (N/A Uterus)     Patient location during evaluation: PACU Anesthesia Type: Epidural Level of consciousness: awake and alert and oriented Pain management: pain level controlled Vital Signs Assessment: post-procedure vital signs reviewed and stable Respiratory status: spontaneous breathing, nonlabored ventilation and respiratory function stable Cardiovascular status: stable and blood pressure returned to baseline Postop Assessment: no headache, no backache, epidural receding, patient able to bend at knees and no apparent nausea or vomiting Anesthetic complications: no    Last Vitals:  Vitals:   09/02/17 1051 09/02/17 1052  BP:    Pulse: 65 66  Resp: 17 17  Temp:  (!) 35.6 C  SpO2: 97% 94%    Last Pain:  Vitals:   09/02/17 1045  TempSrc:   PainSc: Asleep   Pain Goal:                 Elison Worrel A.

## 2017-09-03 LAB — CBC
HCT: 23.8 % — ABNORMAL LOW (ref 36.0–46.0)
Hemoglobin: 8.3 g/dL — ABNORMAL LOW (ref 12.0–15.0)
MCH: 30.5 pg (ref 26.0–34.0)
MCHC: 34.9 g/dL (ref 30.0–36.0)
MCV: 87.5 fL (ref 78.0–100.0)
PLATELETS: 191 10*3/uL (ref 150–400)
RBC: 2.72 MIL/uL — AB (ref 3.87–5.11)
RDW: 15.7 % — AB (ref 11.5–15.5)
WBC: 13.2 10*3/uL — ABNORMAL HIGH (ref 4.0–10.5)

## 2017-09-03 LAB — COMPREHENSIVE METABOLIC PANEL
ALT: 13 U/L — AB (ref 14–54)
ANION GAP: 10 (ref 5–15)
AST: 17 U/L (ref 15–41)
Albumin: 2.5 g/dL — ABNORMAL LOW (ref 3.5–5.0)
Alkaline Phosphatase: 68 U/L (ref 38–126)
BUN: 27 mg/dL — ABNORMAL HIGH (ref 6–20)
CHLORIDE: 100 mmol/L — AB (ref 101–111)
CO2: 21 mmol/L — AB (ref 22–32)
CREATININE: 2.03 mg/dL — AB (ref 0.44–1.00)
Calcium: 9 mg/dL (ref 8.9–10.3)
GFR calc non Af Amer: 29 mL/min — ABNORMAL LOW (ref >60)
GFR, EST AFRICAN AMERICAN: 34 mL/min — AB (ref >60)
Glucose, Bld: 87 mg/dL (ref 65–99)
POTASSIUM: 4.7 mmol/L (ref 3.5–5.1)
SODIUM: 131 mmol/L — AB (ref 135–145)
Total Bilirubin: 1.1 mg/dL (ref 0.3–1.2)
Total Protein: 5.3 g/dL — ABNORMAL LOW (ref 6.5–8.1)

## 2017-09-03 MED ORDER — FERROUS SULFATE 325 (65 FE) MG PO TABS
325.0000 mg | ORAL_TABLET | Freq: Two times a day (BID) | ORAL | Status: DC
  Administered 2017-09-03 – 2017-09-06 (×6): 325 mg via ORAL
  Filled 2017-09-03 (×6): qty 1

## 2017-09-03 NOTE — Progress Notes (Addendum)
Felt lightheaded x1 this am - went from sitting to standing; no other c/o lightheadedness/dizziness Normal lochia, +flatus, no difficulty voiding; pain controlled, tol po  Patient Vitals for the past 24 hrs:  BP Temp Temp src Pulse Resp SpO2  09/03/17 1126 (!) 101/55 98 F (36.7 C) Oral 61 18 -  09/03/17 0800 116/67 97.6 F (36.4 C) Oral 61 20 98 %  09/03/17 0346 125/66 98.5 F (36.9 C) Oral 61 17 99 %  09/03/17 0005 (!) 154/88 99 F (37.2 C) Oral 70 18 99 %  09/02/17 2100 - - - - 18 98 %  09/02/17 1900 (!) 144/93 98 F (36.7 C) - 61 18 99 %  09/02/17 1737 (!) 148/93 - - 66 18 98 %  09/02/17 1708 (!) 144/88 - - (!) 55 - -  09/02/17 1433 (!) 149/89 97.7 F (36.5 C) Oral (!) 58 18 96 %  09/02/17 1327 (!) 155/88 98.1 F (36.7 C) Oral 61 17 95 %   I/o:   Intake/Output Summary (Last 24 hours) at 09/03/17 1255 Last data filed at 09/03/17 0700  Gross per 24 hour  Intake          1483.33 ml  Output             1200 ml  Net           283.33 ml     A&ox3 rrr ctab Abd: +bs, soft, nt, nd; dressing: c/d/i; fundus firm and at umb to 1cm above (fibroids) LE: 1+ edema, nt bilat   CBC    Component Value Date/Time   WBC 13.2 (H) 09/03/2017 0522   RBC 2.72 (L) 09/03/2017 0522   HGB 8.3 (L) 09/03/2017 0522   HCT 23.8 (L) 09/03/2017 0522   PLT 191 09/03/2017 0522   MCV 87.5 09/03/2017 0522   MCH 30.5 09/03/2017 0522   MCHC 34.9 09/03/2017 0522   RDW 15.7 (H) 09/03/2017 0522   LYMPHSABS 2.2 02/17/2014 1800   MONOABS 0.6 02/17/2014 1800   EOSABS 0.2 02/17/2014 1800   BASOSABS 0.0 02/17/2014 1800   A/p: pod 1 s/p c/s with T incision and myomectomy 1. Doing well, contin post op care; ambulate today 2. Infant now in nicu, wants circ when ready 3. Chronic anemia with acute change - begin iron bid; will follow closely and consider iv iron if pt with further sx 4. chtn - mild range last night, normal to low normal this am - follow closely; normal bleeding, holding bp meds 5. Chronic  renal dz - cmp pending for today but has been stable; creatinine slightly elevated but was trending down at time of admission; adequate UOP and foley was d/c'd

## 2017-09-03 NOTE — Progress Notes (Signed)
CSW acknowledges NICU admission.    Patient screened out for psychosocial assessment since none of the following apply:  Psychosocial stressors documented in mother or baby's chart  Gestation less than 32 weeks  Code at delivery   Infant with anomalies  Please contact the Clinical Social Worker if specific needs arise, or by MOB's request.  Oda Cogan, MSW, [facility]  Office: [phone]

## 2017-09-03 NOTE — Anesthesia Postprocedure Evaluation (Signed)
Anesthesia Post Note  Patient: Michel Santee  Procedure(s) Performed: CESAREAN SECTION (N/A ) MYOMECTOMY (N/A Uterus)     Patient location during evaluation: Mother Baby Anesthesia Type: Epidural Level of consciousness: awake and alert and oriented Pain management: satisfactory to patient (1/10) Vital Signs Assessment: post-procedure vital signs reviewed and stable Respiratory status: respiratory function stable Cardiovascular status: stable Postop Assessment: no headache, no backache, epidural receding, patient able to bend at knees, no signs of nausea or vomiting and adequate PO intake Anesthetic complications: no    Last Vitals:  Vitals:   09/03/17 0005 09/03/17 0346  BP: (!) 154/88 125/66  Pulse: 70 61  Resp: 18 17  Temp: 37.2 C 36.9 C  SpO2: 99% 99%    Last Pain:  Vitals:   09/03/17 0654  TempSrc:   PainSc: 0-No pain   Pain Goal: Patients Stated Pain Goal: 3 (09/02/17 1530)               Katherina Mires

## 2017-09-03 NOTE — Progress Notes (Signed)
Foley Cath removed at 0700 hrs, all parts accounted for. Patient tolerated procedure well. Peri care performed independently. We will continue to monitor urine output.

## 2017-09-03 NOTE — Addendum Note (Signed)
Addendum  created 09/03/17 1674 by Flossie Dibble, CRNA   Sign clinical note

## 2017-09-04 DIAGNOSIS — D509 Iron deficiency anemia, unspecified: Secondary | ICD-10-CM | POA: Diagnosis present

## 2017-09-04 LAB — CBC WITH DIFFERENTIAL/PLATELET
BASOS PCT: 0 %
BLASTS: 0 %
Band Neutrophils: 0 %
Basophils Absolute: 0 10*3/uL (ref 0.0–0.1)
Eosinophils Absolute: 0 10*3/uL (ref 0.0–0.7)
Eosinophils Relative: 0 %
HEMATOCRIT: 21.4 % — AB (ref 36.0–46.0)
HEMOGLOBIN: 7.4 g/dL — AB (ref 12.0–15.0)
LYMPHS PCT: 12 %
Lymphs Abs: 1.7 10*3/uL (ref 0.7–4.0)
MCH: 30.2 pg (ref 26.0–34.0)
MCHC: 34.6 g/dL (ref 30.0–36.0)
MCV: 87.3 fL (ref 78.0–100.0)
MONO ABS: 0.6 10*3/uL (ref 0.1–1.0)
MYELOCYTES: 0 %
Metamyelocytes Relative: 0 %
Monocytes Relative: 4 %
NEUTROS PCT: 84 %
NRBC: 0 /100{WBCs}
Neutro Abs: 12.1 10*3/uL — ABNORMAL HIGH (ref 1.7–7.7)
OTHER: 0 %
PROMYELOCYTES ABS: 0 %
Platelets: 179 10*3/uL (ref 150–400)
RBC: 2.45 MIL/uL — AB (ref 3.87–5.11)
RDW: 15.8 % — AB (ref 11.5–15.5)
WBC: 14.4 10*3/uL — AB (ref 4.0–10.5)

## 2017-09-04 LAB — COMPREHENSIVE METABOLIC PANEL
ALK PHOS: 65 U/L (ref 38–126)
ALT: 15 U/L (ref 14–54)
ANION GAP: 9 (ref 5–15)
AST: 19 U/L (ref 15–41)
Albumin: 2.5 g/dL — ABNORMAL LOW (ref 3.5–5.0)
BUN: 30 mg/dL — ABNORMAL HIGH (ref 6–20)
CALCIUM: 8.8 mg/dL — AB (ref 8.9–10.3)
CO2: 21 mmol/L — AB (ref 22–32)
CREATININE: 1.84 mg/dL — AB (ref 0.44–1.00)
Chloride: 108 mmol/L (ref 101–111)
GFR, EST AFRICAN AMERICAN: 38 mL/min — AB (ref >60)
GFR, EST NON AFRICAN AMERICAN: 33 mL/min — AB (ref >60)
Glucose, Bld: 90 mg/dL (ref 65–99)
Potassium: 4 mmol/L (ref 3.5–5.1)
SODIUM: 138 mmol/L (ref 135–145)
Total Bilirubin: 0.7 mg/dL (ref 0.3–1.2)
Total Protein: 5.9 g/dL — ABNORMAL LOW (ref 6.5–8.1)

## 2017-09-04 MED ORDER — NIFEDIPINE ER OSMOTIC RELEASE 30 MG PO TB24
60.0000 mg | ORAL_TABLET | Freq: Every day | ORAL | Status: DC
  Administered 2017-09-04 – 2017-09-05 (×2): 60 mg via ORAL
  Filled 2017-09-04 (×2): qty 2

## 2017-09-04 MED ORDER — MAGNESIUM OXIDE 400 (241.3 MG) MG PO TABS
400.0000 mg | ORAL_TABLET | Freq: Every day | ORAL | Status: AC
  Administered 2017-09-04 – 2017-09-05 (×2): 400 mg via ORAL
  Filled 2017-09-04 (×3): qty 1

## 2017-09-04 NOTE — Lactation Note (Addendum)
This note was copied from a baby's chart. Lactation Consultation Note  Patient Name: [name] DAHFF'N Date: 09/04/2017 Reason for consult: Initial assessment;NICU baby  NICU baby 49 hours old. Mom reports that she nursed 2 older children (teenagers) without any issues. Mom states that she has pumped twice and started seeing drops the second time. Enc mom to pump every 2-3 hours for a total of 8-12 times/24 hours followed by hand expression. Enc mom to offer baby STS and nuzzling/latching as she and baby able. Mom reports that she has a personal DEBP and she is aware of pumping rooms in NICU--enc mom to take pumping kit with her at D/C, and bring for use in NICU. Mom aware of OP/BFSG and [facility] phone line assistance after D/C.   Maternal Data Has patient been taught Hand Expression?: Yes Does the patient have breastfeeding experience prior to this delivery?: Yes  Feeding Feeding Type: Formula Nipple Type: Slow - flow Length of feed: 20 min  LATCH Score                   Interventions    Lactation Tools Discussed/Used Tools: Pump Breast pump type: Double-Electric Breast Pump Pump Review: Setup, frequency, and cleaning;Milk Storage Initiated by:: Bedside RN. Date initiated:: 09/04/17   Consult Status Consult Status: PRN    Andres Labrum 09/04/2017, 12:30 PM

## 2017-09-04 NOTE — Progress Notes (Signed)
POSTOPERATIVE DAY # 2 S/P CS   S:         Reports feeling more sore today  / no PIH symptoms             Tolerating po intake / no nausea / no vomiting / + flatus / no BM             Bleeding is light             Pain controlled with motrin mostly             Up ad lib / ambulatory/ voiding QS  Newborn in NICU    O:  VS: BP (!) 144/85 (BP Location: Left Arm)   Pulse 92   Temp 99.2 F (37.3 C) (Oral)   Resp 16   Ht 5\' 10"  (1.778 m)   Wt 108.4 kg (239 lb)   SpO2 100%   Breastfeeding? Unknown   BMI 34.29 kg/m              BP:144/85 - 141/75 - 131/73 - 131/64 - 132/71 - 119/60   LABS:               Recent Labs  09/03/17 0522 09/04/17 0553  WBC 13.2* 14.4*  HGB 8.3* 7.4*  PLT 191 179               Bloodtype: --/--/AB POS (10/05 1850)  Rubella:    Immune                                       I&O: Intake/Output      10/07 0701 - 10/08 0700 10/08 0701 - 10/09 0700   P.O. 600    Total Intake(mL/kg) 600 (5.5)    Urine (mL/kg/hr) 2800 (1.1)    Total Output 2800     Net -2200          Urine Occurrence 1 x                 Physical Exam:             Alert and Oriented X3  Lungs: Clear and unlabored  Heart: regular rate and rhythm / no mumurs  Abdomen: soft, non-tender, mildy distended              Fundus: firm, non-tender, Ueven             Dressing intact              Incision:  approximated with suture / no erythema / no ecchymosis / dried drainage  Perineum: intact  Lochia: light  Extremities: 1+ pedal edema, no calf pain or tenderness, negative Homans  A:        POD # 2 S/P CS            Chronic hypertension with renal disease - stable labs / improved kidney function studies            IDA with ABL anemia  P:        Routine postoperative care              Iron and magnesium             Ambulate PRN             DC Iv today  Re-start Procardia 60XL per Dr Pamala Hurry - repeat CBC and CMP tomorrow     Artelia Laroche CNM, MSN, [facility] 09/04/2017, 8:45  AM

## 2017-09-05 ENCOUNTER — Inpatient Hospital Stay (HOSPITAL_COMMUNITY): Admission: RE | Admit: 2017-09-05 | Payer: 59 | Source: Ambulatory Visit

## 2017-09-05 LAB — COMPREHENSIVE METABOLIC PANEL
ALK PHOS: 76 U/L (ref 38–126)
ALT: 17 U/L (ref 14–54)
AST: 16 U/L (ref 15–41)
Albumin: 2.6 g/dL — ABNORMAL LOW (ref 3.5–5.0)
Anion gap: 7 (ref 5–15)
BUN: 28 mg/dL — AB (ref 6–20)
CALCIUM: 9.2 mg/dL (ref 8.9–10.3)
CHLORIDE: 106 mmol/L (ref 101–111)
CO2: 25 mmol/L (ref 22–32)
CREATININE: 1.5 mg/dL — AB (ref 0.44–1.00)
GFR calc non Af Amer: 42 mL/min — ABNORMAL LOW (ref >60)
GFR, EST AFRICAN AMERICAN: 49 mL/min — AB (ref >60)
Glucose, Bld: 93 mg/dL (ref 65–99)
Potassium: 4 mmol/L (ref 3.5–5.1)
SODIUM: 138 mmol/L (ref 135–145)
Total Bilirubin: 0.5 mg/dL (ref 0.3–1.2)
Total Protein: 6.6 g/dL (ref 6.5–8.1)

## 2017-09-05 LAB — CBC WITH DIFFERENTIAL/PLATELET
Basophils Absolute: 0 10*3/uL (ref 0.0–0.1)
Basophils Relative: 0 %
EOS ABS: 0.2 10*3/uL (ref 0.0–0.7)
Eosinophils Relative: 2 %
HCT: 22.9 % — ABNORMAL LOW (ref 36.0–46.0)
HEMOGLOBIN: 7.7 g/dL — AB (ref 12.0–15.0)
LYMPHS ABS: 1.7 10*3/uL (ref 0.7–4.0)
LYMPHS PCT: 14 %
MCH: 29.6 pg (ref 26.0–34.0)
MCHC: 33.6 g/dL (ref 30.0–36.0)
MCV: 88.1 fL (ref 78.0–100.0)
Monocytes Absolute: 0.5 10*3/uL (ref 0.1–1.0)
Monocytes Relative: 4 %
NEUTROS PCT: 80 %
Neutro Abs: 9.8 10*3/uL — ABNORMAL HIGH (ref 1.7–7.7)
Platelets: 223 10*3/uL (ref 150–400)
RBC: 2.6 MIL/uL — AB (ref 3.87–5.11)
RDW: 15.7 % — ABNORMAL HIGH (ref 11.5–15.5)
WBC: 12.2 10*3/uL — AB (ref 4.0–10.5)

## 2017-09-05 MED ORDER — DOCUSATE SODIUM 100 MG PO CAPS
100.0000 mg | ORAL_CAPSULE | Freq: Two times a day (BID) | ORAL | Status: DC
  Administered 2017-09-05 – 2017-09-06 (×2): 100 mg via ORAL
  Filled 2017-09-05 (×3): qty 1

## 2017-09-05 MED ORDER — ZOLPIDEM TARTRATE 5 MG PO TABS
5.0000 mg | ORAL_TABLET | Freq: Every evening | ORAL | Status: DC | PRN
  Administered 2017-09-05: 5 mg via ORAL
  Filled 2017-09-05: qty 1

## 2017-09-05 MED ORDER — NIFEDIPINE ER OSMOTIC RELEASE 30 MG PO TB24
30.0000 mg | ORAL_TABLET | Freq: Once | ORAL | Status: AC
  Administered 2017-09-05: 30 mg via ORAL
  Filled 2017-09-05: qty 1

## 2017-09-05 MED ORDER — IBUPROFEN 600 MG PO TABS
600.0000 mg | ORAL_TABLET | Freq: Four times a day (QID) | ORAL | Status: DC | PRN
  Administered 2017-09-05: 600 mg via ORAL
  Filled 2017-09-05: qty 1

## 2017-09-05 MED ORDER — LABETALOL HCL 200 MG PO TABS
200.0000 mg | ORAL_TABLET | Freq: Three times a day (TID) | ORAL | Status: DC
  Administered 2017-09-05: 200 mg via ORAL
  Filled 2017-09-05: qty 1

## 2017-09-05 MED ORDER — LABETALOL HCL 100 MG PO TABS
300.0000 mg | ORAL_TABLET | Freq: Three times a day (TID) | ORAL | Status: DC
  Administered 2017-09-05 – 2017-09-06 (×2): 300 mg via ORAL
  Filled 2017-09-05 (×2): qty 1

## 2017-09-05 MED ORDER — NIFEDIPINE ER OSMOTIC RELEASE 30 MG PO TB24
90.0000 mg | ORAL_TABLET | Freq: Every day | ORAL | Status: DC
  Administered 2017-09-06: 90 mg via ORAL
  Filled 2017-09-05: qty 3

## 2017-09-05 NOTE — Progress Notes (Signed)
POSTOPERATIVE DAY # 3 S/P 3 / chronic hypertension  S:         Reports feeling well - not much pain / no PIH symptoms / wants to go home if possible             Tolerating po intake / no nausea / no vomiting / + flatus / + BM             Bleeding is light             Pain controlled with motrin             Up ad lib / ambulatory/ voiding QS  Newborn in NICU   O:  VS: BP (!) 159/94 (BP Location: Left Arm)   Pulse 81   Temp 97.9 F (36.6 C) (Oral)   Resp 16   Ht 5\' 10"  (1.778 m)   Wt 108.4 kg (239 lb)   SpO2 100%   Breastfeeding? Unknown   BMI 34.29 kg/m    BP: 155/94 - 160/89 - 149/79 - 144/86                            I&O: Intake/Output      10/08 0701 - 10/09 0700 10/09 0701 - 10/10 0700   P.O. 1820    Total Intake(mL/kg) 1820 (16.8)    Urine (mL/kg/hr) 3175 (1.2) 250 (0.4)   Total Output 3175 250   Net -1355 -250                   Physical Exam:             Alert and Oriented X3  Lungs: Clear and unlabored  Heart: regular rate and rhythm / no mumurs  Abdomen: soft, non-tender, non-distended              Fundus: firm, non-tender, U-1             Dressing intact               Incision:  approximated with suture / no erythema / no ecchymosis / no drainage  Perineum: intact  Lochia: light spotting  Extremities: trace - 1+ pedal edema, no calf pain or tenderness, negative Homans  A:        POD # 3 S/P CS            Chronic hypertension without evidence of PEC  P:        Routine postoperative care              Add Labetalol 200 TID to Procardia 60Xl             Consider DC this PM if remains stable      Artelia Laroche CNM, MSN, FACNM 09/05/2017, 12:58 PM

## 2017-09-05 NOTE — Progress Notes (Signed)
POSTOPERATIVE DAY # 3 S/P CS  In NICU  O:  VS: BP (!) 160/89 (BP Location: Left Arm)   Pulse 96   Temp 98.3 F (36.8 C) (Oral)   Resp 16   Ht 5\' 10"  (1.778 m)   Wt 108.4 kg (239 lb)   SpO2 97%   Breastfeeding? Unknown   BMI 34.29 kg/m    LABS: Creatinine 0.50              Recent Labs  09/04/17 0553 09/05/17 0514  WBC 14.4* 12.2*  HGB 7.4* 7.7*  PLT 179 223   Re-visit at lunch   Artelia Laroche CNM, MSN, [facility] 09/05/2017, 8:05 AM

## 2017-09-05 NOTE — Lactation Note (Signed)
This note was copied from a baby's chart. Lactation Consultation Note  Patient Name: [name] Date: 09/05/2017 Reason for consult: Follow-up assessment   Provided mother with manual pump.  States she will get a DEBP from insurance. Reviewed engorgement care.  Recommend pumping q 3 hours with hand expression before and after. Mother denies questions or concerns.    Maternal Data    Feeding Feeding Type: Formula Nipple Type: Slow - flow Length of feed: 10 min  LATCH Score                   Interventions    Lactation Tools Discussed/Used     Consult Status Consult Status: Complete    Carlye Grippe 09/05/2017, 12:11 PM

## 2017-09-05 NOTE — Progress Notes (Signed)
Patient ID: [name], female   DOB: 09/28/1976, 41 y.o.   MRN: [id number]  Pt upset, wants to go home. She has been upset all days as she was planning to leave this morning and we are holding her discharge due to high BPs, she denies any symptoms, thinks BPs are getting worse as she is gets more upset.  She will leave tomorrow at 12 noon as her older son has Pediatric appointment for his ADHD and she has moved that twice.   Temp:  [97.9 F (36.6 C)-98.7 F (37.1 C)] 98.4 F (36.9 C) (10/09 2021) Pulse Rate:  [77-96] 77 (10/09 2021) Resp:  [16-17] 17 (10/09 2021) BP: (151-166)/(85-94) 157/87 (10/09 2021) SpO2:  [97 %-100 %] 100 % (10/09 2021) One BP when I was in room was 180/111 when she was upset and tearing up.   Pt has renal disease, creatinine has improved, she is diuresing well, wants to defer diuretics. I/O reviewed.  Will increase all meds to almost the level prior to IOL. ie Procardia 90 mg XL, Labetalol 300mg  q8 hrs (was on 450mg  tid) and will add Ambien tonight.  Baby in NICU, ok to discharge when mother is ready.   Reviewed reportable signs.  If allowed discharge tomorrow AM, f/up needed in 72 hrs.   V.Demitrious Mccannon,. MD

## 2017-09-06 DIAGNOSIS — Z9889 Other specified postprocedural states: Secondary | ICD-10-CM

## 2017-09-06 MED ORDER — NIFEDIPINE ER OSMOTIC RELEASE 90 MG PO TB24: 90.0000 mg | ORAL_TABLET | Freq: Every day | ORAL | 3 refills | Status: AC

## 2017-09-06 MED ORDER — IBUPROFEN 600 MG PO TABS: 600.0000 mg | ORAL_TABLET | Freq: Four times a day (QID) | ORAL | 0 refills | Status: AC | PRN

## 2017-09-06 MED ORDER — LABETALOL HCL 300 MG PO TABS: 300.0000 mg | ORAL_TABLET | Freq: Three times a day (TID) | ORAL | 3 refills | Status: AC

## 2017-09-06 NOTE — Progress Notes (Signed)
POSTOPERATIVE DAY # 4 S/P Primary cesarean section with "T" incision for NRFHR r/t Chronic Hypertension, Chronic renal disease   S:         Reports feeling well and ready to go home  Denies HA, visual changes, RUQ/epigastric pain             Tolerating po intake / no nausea / no vomiting / + flatus / + BM  Denies dizziness, SOB, or CP             Bleeding is light             Pain controlled with Motrin and Tylenol             Up ad lib / ambulatory/ voiding QS  Newborn bottle feeding pumped breast milk / Circumcision - done yesterday   O:  VS: BP 132/77 (BP Location: Right Arm)   Pulse 65   Temp 98.3 F (36.8 C) (Oral)   Resp 18   Ht 5\' 10"  (1.778 m)   Wt 108.4 kg (239 lb)   SpO2 100%   Breastfeeding? Unknown   BMI 34.29 kg/m    LABS:               Recent Labs  09/04/17 0553 09/05/17 0514  WBC 14.4* 12.2*  HGB 7.4* 7.7*  PLT 179 223               Bloodtype: --/--/AB POS (10/05 1850)  Rubella: Immune                                             I&O: Intake/Output      10/09 0701 - 10/10 0700 10/10 0701 - 10/11 0700   Urine (mL/kg/hr) 950 (0.4)    Total Output 950     Net -950                       Physical Exam:             Alert and Oriented X3  Lungs: Clear and unlabored  Heart: regular rate and rhythm / no murmurs  Abdomen: soft, non-tender, non-distended, active bowel sounds in all quadrants             Fundus: firm, non-tender, U-3             Dressing: honeycomb dsg with steri-strips small shadowing noted, otherwise clean/dry/intact              Incision:  approximated with sutures / no erythema / no ecchymosis / no drainage  Perineum: intact  Lochia: appropriate, no clots  Extremities: trace pedal edema, no calf pain or tenderness,   A:        POD # 4 Primary cesarean section with "T" incision for NRFHR r/t Chronic Hypertension  Chronic renal disease  ABL Anemia compounding IDA - stable, asymptomatic            S/p Myomectomy - 5 cm fibroid  P:         Routine postoperative care              BPs much improved this morning with Procardia 90mg  XL daily and Labetalol 300mg  q8 hours.    No s/s of PEC  Pt. Declines narcotic medication  Will plan to resume previously prescribed iron supplement  Discharge home  today  WOB discharge book and instructions reviewed   F/u with Dr. Benjie Karvonen on Monday for BP check  Consult for plan of care: Dr.  Sink, MSN, CNM [facility]

## 2017-09-06 NOTE — Discharge Summary (Signed)
Obstetric Discharge Summary   Patient Name: [name] DOB: 26-Aug-1976 MRN: [id number]  Date of Admission: 09/01/2017 Date of Discharge: 09/06/2017 Date of Delivery: 09/02/2017 Gestational Age at Delivery: [redacted]w[redacted]d  Primary OB: [facility] - Dr. Benjie Karvonen  Antepartum complications:  - Chronic Hypertension requiring Labetalol 450mg  TID and Procardia 90mg  XL daily - AMA - Chronic Kidney Disease - Proteinuria  - Uterine fibroids - GBS Positive  - Hx. Of LEEP  - Unstable fetal lie Prenatal Labs:  ABO, Rh: --/--/AB POS (10/05 1850) Antibody: NEG (10/05 1850) Rubella: Immune (04/03 0000) RPR: Nonreactive (04/03 0000)  HBsAg: Negative (04/03 0000)  HIV: Non-reactive (04/03 0000)  GBS:   positive 1 hr Glucola - passed Genetic screening - negative Anatomy US - normal, fibroids  Admitting Diagnosis: IOL at 36+6 weeks for Chronic Hypertension with worsening disease   Secondary Diagnoses: Patient Active Problem List   Diagnosis Date Noted  . S/P myomectomy: during primary c/s 5cm fibroid removed at uterine incision 09/06/2017  . IDA (iron deficiency anemia) 09/04/2017  . Postpartum care following cesarean delivery (10/6) 09/02/2017  . Cesarean delivery delivered: indication: NRFHR; chronic hypertension 09/02/2017  . Chronic hypertension affecting pregnancy 09/02/2017  . Encounter for planned induction of labor 09/01/2017  . Renal disease in pregnancy in third trimester 08/18/2017  . Renal failure, acute on chronic (HCC) 02/18/2014  . Acute renal failure ([place]) 02/17/2014    Date of Delivery: 09/02/2017 Delivered By: Dr. Elnora Morrison. Sigmon CNM assist/ Dr. Harolyn Rutherford with [facility] Delivery Type: Primary low transverse with "T" uterine incision cesarean section  Complications: Failed IOL due to [facility]; unstable lie - baby breech on delivery; large uterine fibroid at incision, myomectomy performed  Newborn Data: Live born female  Birth Weight: 5 lb 7.7 oz (2485 g) APGAR: 7,  9  Newborn Delivery   Birth date/time:  09/02/2017 08:34:00 Delivery type:  C-Section, Low Transverse  C-section categorization:  Primary      Postpartum Course  (Cesarean Section):  Patient's postpartum course was complicated by elevated blood pressures, requiring an additional night in the hospital to stabilize blood pressures. Her medication was increased to Labetalol 300mg  TID and Procardia 90mg  XL daily.   By time of discharge on POD#4, her blood pressures were stable, pain was controlled on oral pain medications; she had appropriate lochia and was ambulating, voiding without difficulty, tolerating regular diet and passing flatus and bowel movements.   She had significant anemia, but was asymptomatic and continued on oral FE. She was deemed stable for discharge to home.     Labs: CBC Latest Ref Rng & Units 09/05/2017 09/04/2017 09/03/2017  WBC 4.0 - 10.5 K/uL 12.2(H) 14.4(H) 13.2(H)  Hemoglobin 12.0 - 15.0 g/dL 7.7(L) 7.4(L) 8.3(L)  Hematocrit 36.0 - 46.0 % 22.9(L) 21.4(L) 23.8(L)  Platelets 150 - 400 K/uL 223 179 191   AB POS  Physical exam:  BP 132/77 (BP Location: Right Arm)   Pulse 65   Temp 98.3 F (36.8 C) (Oral)   Resp 18   Ht 5\' 10"  (1.778 m)   Wt 108.4 kg (239 lb)   SpO2 100%   Breastfeeding? Unknown   BMI 34.29 kg/m  General: alert and no distress Pulm: normal respiratory effort Lochia: appropriate Abdomen: soft, NT Uterine Fundus: firm, below umbilicus Perineum: healing well, no significant erythema, no significant edema Incision: c/d/i, healing well, no significant drainage, no dehiscence, no significant erythema Extremities: No evidence of DVT seen on physical exam. No lower extremity edema.   Disposition: stable,  discharge to home Baby Feeding: breast milk Baby Disposition: home with mom  Contraception: unsure  Rh Immune globulin given: N/A Rubella vaccine given: N/A Tdap vaccine given in AP or PP setting: 05/11/17 Flu vaccine given in AP or PP  setting: 08/29/17   Plan:  JAWANDA PASSEY was discharged to home in good condition. Follow-up appointment at [facility] on Monday with Dr. Benjie Karvonen.   Discharge Instructions: Per After Visit Summary. Activity: Advance as tolerated. Pelvic rest for 6 weeks.  Refer to After Visit Summary Diet: Regular, Heart Healthy Discharge Medications: Allergies as of 09/06/2017      Reactions   Shellfish Allergy Hives      Medication List    TAKE these medications   calcium carbonate 500 MG chewable tablet Commonly known as:  TUMS - dosed in mg elemental calcium Chew 2 tablets by mouth 3 (three) times daily as needed for indigestion or heartburn.   ferrous sulfate 325 (65 FE) MG tablet Take 325 mg by mouth daily with breakfast.   ibuprofen 600 MG tablet Commonly known as:  ADVIL,MOTRIN Take 1 tablet (600 mg total) by mouth every 6 (six) hours as needed for cramping.   labetalol 300 MG tablet Commonly known as:  NORMODYNE Take 1 tablet (300 mg total) by mouth every 8 (eight) hours. What changed:  how much to take  when to take this   NIFEdipine 90 MG 24 hr tablet Commonly known as:  PROCARDIA XL/ADALAT-CC Take 1 tablet (90 mg total) by mouth daily.   prenatal multivitamin Tabs tablet Take 1 tablet by mouth every evening.      Outpatient follow up:  Follow-up Information    Azucena Fallen, MD. Schedule an appointment as soon as possible for a visit on 09/11/2017.   Specialty:  Obstetrics and Gynecology Why:  Postpartum blood pressure check, then in 6 weeks for postpartum visit Contact information: [address] [place] [address] [phone]           Signed:  Lars Pinks, MSN, CNM [facility]

## 2017-09-06 NOTE — Progress Notes (Signed)
Discharge instructions given to pt and s/o. Pt verbalized understanding. 10:00 am procardia dose given before discharge. Pt being wheeled down in stable condition.

## 2019-02-09 DIAGNOSIS — B379 Candidiasis, unspecified: Secondary | ICD-10-CM | POA: Diagnosis not present

## 2019-02-09 DIAGNOSIS — I10 Essential (primary) hypertension: Secondary | ICD-10-CM | POA: Diagnosis not present

## 2019-02-09 DIAGNOSIS — Z760 Encounter for issue of repeat prescription: Secondary | ICD-10-CM | POA: Diagnosis not present

## 2019-02-09 DIAGNOSIS — H66003 Acute suppurative otitis media without spontaneous rupture of ear drum, bilateral: Secondary | ICD-10-CM | POA: Diagnosis not present

## 2019-05-10 DIAGNOSIS — I10 Essential (primary) hypertension: Secondary | ICD-10-CM | POA: Diagnosis not present

## 2019-05-10 DIAGNOSIS — N183 Chronic kidney disease, stage 3 (moderate): Secondary | ICD-10-CM | POA: Diagnosis not present

## 2019-05-21 DIAGNOSIS — I10 Essential (primary) hypertension: Secondary | ICD-10-CM | POA: Diagnosis not present

## 2019-05-21 DIAGNOSIS — N183 Chronic kidney disease, stage 3 (moderate): Secondary | ICD-10-CM | POA: Diagnosis not present

## 2019-06-03 ENCOUNTER — Other Ambulatory Visit: Payer: Self-pay

## 2019-06-03 ENCOUNTER — Ambulatory Visit (HOSPITAL_COMMUNITY)
Admission: RE | Admit: 2019-06-03 | Discharge: 2019-06-03 | Disposition: A | Discharge status: Home / Self Care | Payer: BC Managed Care – PPO | Source: Ambulatory Visit | Attending: Cardiology | Admitting: Cardiology

## 2019-06-03 ENCOUNTER — Other Ambulatory Visit (HOSPITAL_COMMUNITY): Payer: Self-pay | Admitting: Nephrology

## 2019-06-03 DIAGNOSIS — I10 Essential (primary) hypertension: Secondary | ICD-10-CM | POA: Diagnosis not present

## 2019-06-03 DIAGNOSIS — N179 Acute kidney failure, unspecified: Secondary | ICD-10-CM | POA: Insufficient documentation

## 2019-06-03 NOTE — Progress Notes (Signed)
  Echocardiogram 2D Echocardiogram has been performed.  [name] 06/03/2019, 3:03 PM

## 2019-06-10 DIAGNOSIS — N183 Chronic kidney disease, stage 3 (moderate): Secondary | ICD-10-CM | POA: Diagnosis not present

## 2019-06-10 DIAGNOSIS — I10 Essential (primary) hypertension: Secondary | ICD-10-CM | POA: Diagnosis not present

## 2019-06-24 DIAGNOSIS — N183 Chronic kidney disease, stage 3 (moderate): Secondary | ICD-10-CM | POA: Diagnosis not present

## 2019-07-31 DIAGNOSIS — I10 Essential (primary) hypertension: Secondary | ICD-10-CM | POA: Diagnosis not present

## 2019-07-31 DIAGNOSIS — N183 Chronic kidney disease, stage 3 (moderate): Secondary | ICD-10-CM | POA: Diagnosis not present

## 2019-09-06 DIAGNOSIS — E669 Obesity, unspecified: Secondary | ICD-10-CM | POA: Diagnosis not present

## 2019-09-06 DIAGNOSIS — I10 Essential (primary) hypertension: Secondary | ICD-10-CM | POA: Diagnosis not present

## 2019-09-06 DIAGNOSIS — N183 Chronic kidney disease, stage 3 unspecified: Secondary | ICD-10-CM | POA: Diagnosis not present

## 2019-09-06 DIAGNOSIS — R801 Persistent proteinuria, unspecified: Secondary | ICD-10-CM | POA: Diagnosis not present

## 2019-09-10 DIAGNOSIS — I10 Essential (primary) hypertension: Secondary | ICD-10-CM | POA: Diagnosis not present

## 2019-09-10 DIAGNOSIS — N183 Chronic kidney disease, stage 3 unspecified: Secondary | ICD-10-CM | POA: Diagnosis not present

## 2019-09-17 IMAGING — US US MFM FETAL BPP W/O NON-STRESS
1 series · 12 of 12 positions shown · non-contrast
Comparison: none

[Series 1: us mfm fetal bpp w/o non-stress · 12 acquisitions, 12 frames shown]
[im 1/12]
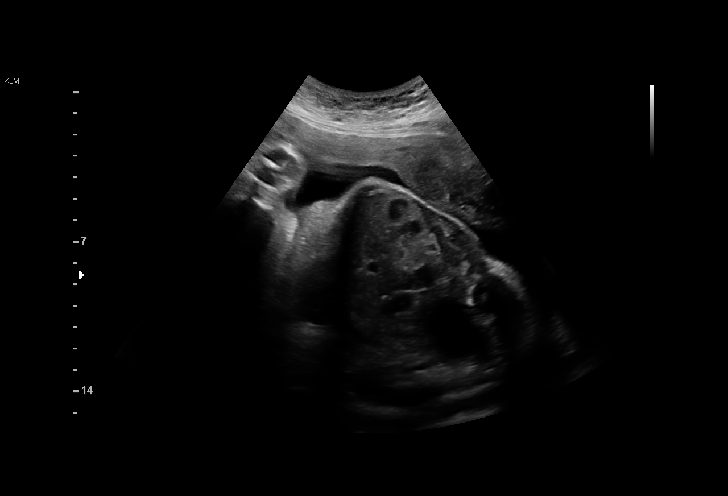
[im 2/12]
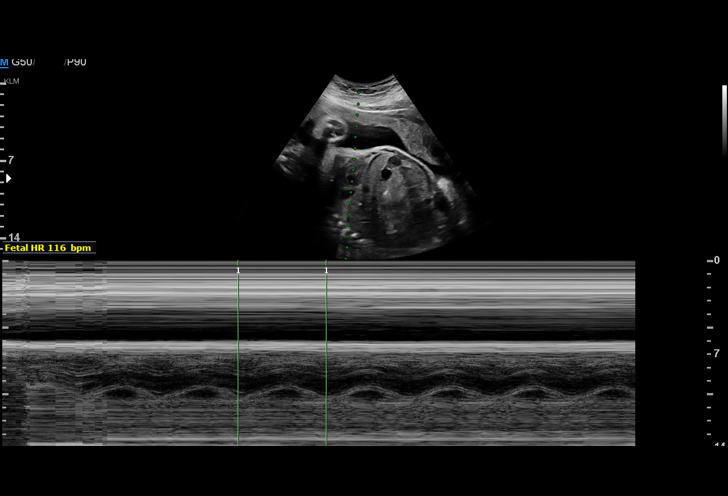
[im 3/12]
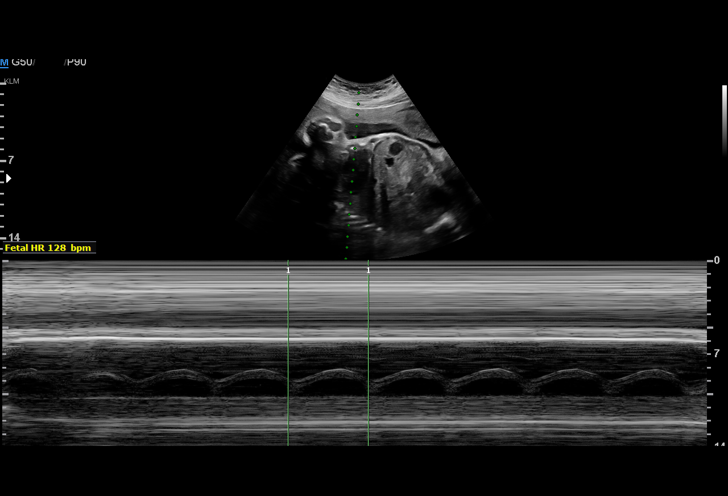
[im 4/12]
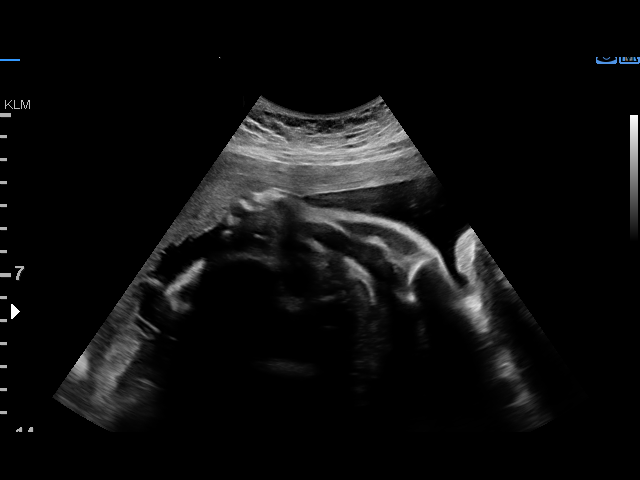
[im 5/12]
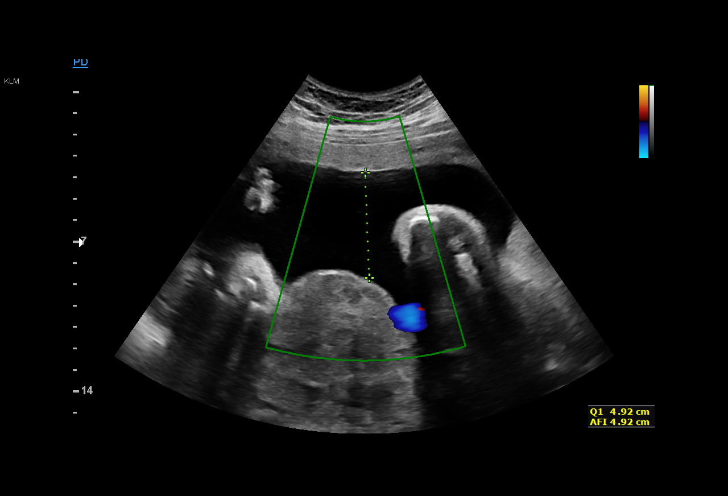
[im 6/12]
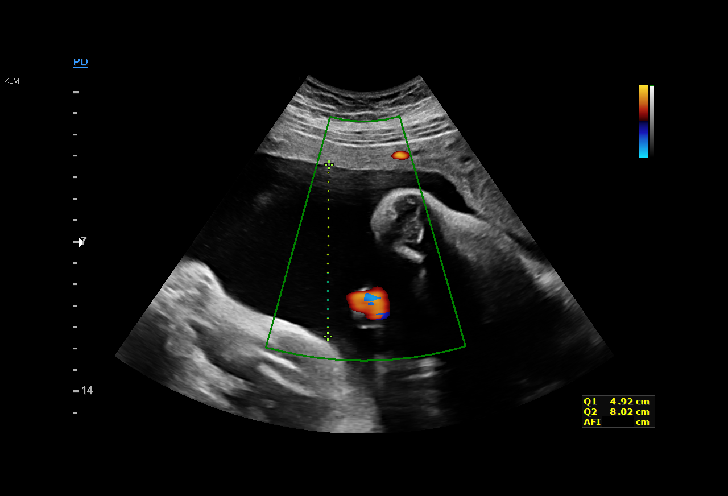
[im 7/12]
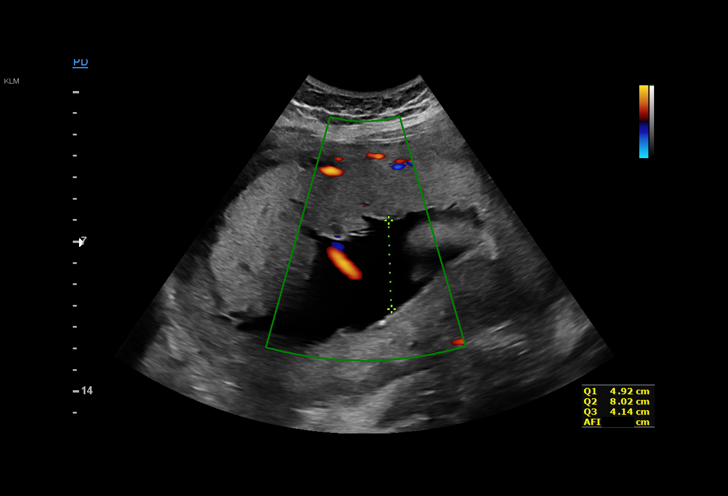
[im 8/12]
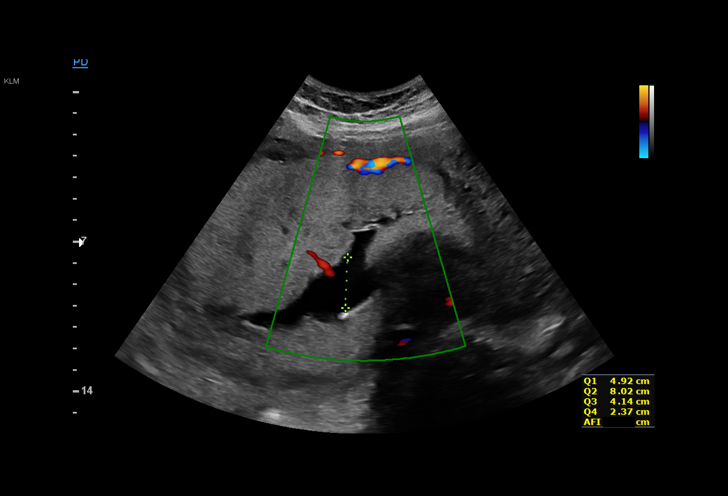
[im 9/12]
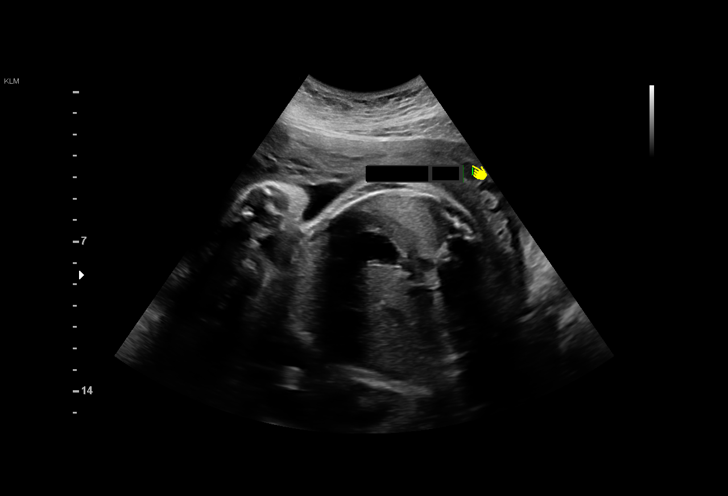
[im 10/12]
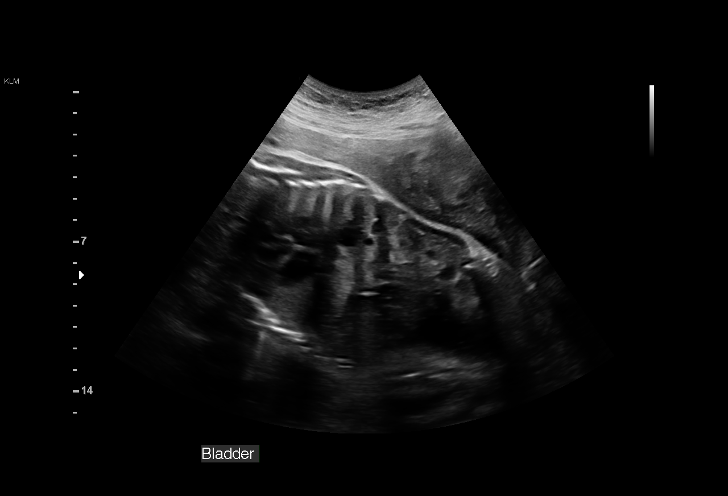
[im 11/12]
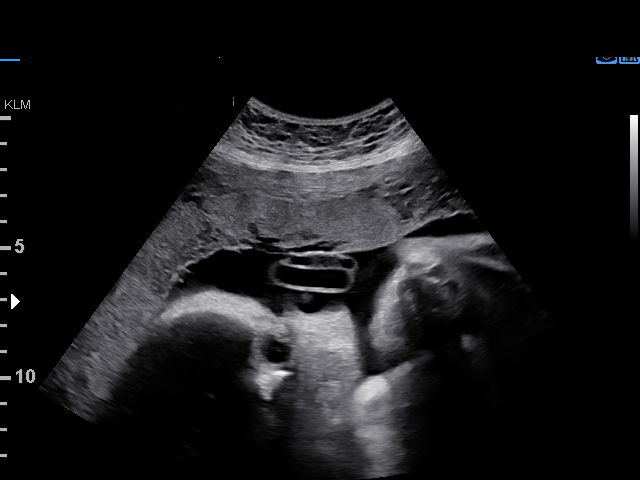
[im 12/12]
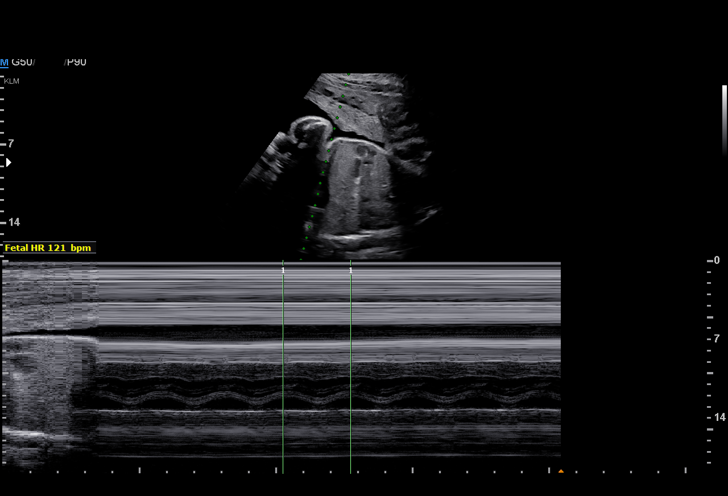

[12 of 12 positions shown; findings below may reference images not displayed]

[facility]
MAU/Triage

1  MOELANDS               [id number]      [id number]     [id number]
TIGER
Indications

34 weeks gestation of pregnancy
Non-reactive NST (Non reactive NST in
Office, prolonged monitoring in [facility], non
reactive)
Advanced maternal age multigravida 35+,
third trimester
Hypertension - Chronic/Pre-existing
OB History

Gravidity:    3         Term:   2        Prem:   0        SAB:   0
TOP:          0       Ectopic:  0        Living: 2
Fetal Evaluation

Num Of Fetuses:     1
Fetal Heart         128
Rate(bpm):
Cardiac Activity:   Observed
Presentation:       Breech

Amniotic Fluid
AFI FV:      Subjectively within normal limits

AFI Sum(cm)     %Tile       Largest Pocket(cm)
19.45           72

RUQ(cm)       RLQ(cm)       LUQ(cm)        LLQ(cm)
4.92
Comment:    [DATE] BPP
Biophysical Evaluation

Amniotic F.V:   Within normal limits       F. Tone:        Observed
F. Movement:    Observed                   Score:          [DATE]
F. Breathing:   Not Observed
Gestational Age

Best:          34w 3d     Det. By:  Previous Ultrasound      EDD:   09/23/17
Impression

IUP at 34+3 weeks with CHTN and AMA. Nonreactive NST
despite prolonged monitoring
Normal amniotic fluid
BPP [DATE] (no fetal breathing noted)
Recommendations

Recommend admission for prolonged continuous monitoring
and BPP in 24 hours

## 2019-10-09 DIAGNOSIS — I10 Essential (primary) hypertension: Secondary | ICD-10-CM | POA: Diagnosis not present

## 2019-11-08 DIAGNOSIS — I10 Essential (primary) hypertension: Secondary | ICD-10-CM | POA: Diagnosis not present

## 2019-11-08 DIAGNOSIS — N183 Chronic kidney disease, stage 3 unspecified: Secondary | ICD-10-CM | POA: Diagnosis not present

## 2020-08-05 DIAGNOSIS — Z6835 Body mass index (BMI) 35.0-35.9, adult: Secondary | ICD-10-CM | POA: Diagnosis not present

## 2020-08-05 DIAGNOSIS — I10 Essential (primary) hypertension: Secondary | ICD-10-CM | POA: Diagnosis not present

## 2020-08-05 DIAGNOSIS — N183 Chronic kidney disease, stage 3 unspecified: Secondary | ICD-10-CM | POA: Diagnosis not present

## 2020-11-04 DIAGNOSIS — I10 Essential (primary) hypertension: Secondary | ICD-10-CM | POA: Diagnosis not present

## 2020-11-04 DIAGNOSIS — N183 Chronic kidney disease, stage 3 unspecified: Secondary | ICD-10-CM | POA: Diagnosis not present

## 2020-11-04 DIAGNOSIS — R809 Proteinuria, unspecified: Secondary | ICD-10-CM | POA: Diagnosis not present

## 2021-02-09 DIAGNOSIS — N183 Chronic kidney disease, stage 3 unspecified: Secondary | ICD-10-CM | POA: Diagnosis not present

## 2021-02-09 DIAGNOSIS — R809 Proteinuria, unspecified: Secondary | ICD-10-CM | POA: Diagnosis not present

## 2021-02-09 DIAGNOSIS — I10 Essential (primary) hypertension: Secondary | ICD-10-CM | POA: Diagnosis not present

## 2021-10-13 ENCOUNTER — Ambulatory Visit: Payer: BC Managed Care – PPO | Attending: Internal Medicine

## 2021-10-13 ENCOUNTER — Other Ambulatory Visit (HOSPITAL_BASED_OUTPATIENT_CLINIC_OR_DEPARTMENT_OTHER): Payer: Self-pay

## 2021-10-13 DIAGNOSIS — Z23 Encounter for immunization: Secondary | ICD-10-CM

## 2021-10-13 MED ORDER — PFIZER COVID-19 VAC BIVALENT 30 MCG/0.3ML IM SUSP
INTRAMUSCULAR | 0 refills | Status: AC
  Filled 2021-10-13: qty 0.3, 1d supply, fill #0

## 2021-10-13 NOTE — Progress Notes (Signed)
   Covid-19 Vaccination Clinic  Name:  [name]    MRN: [id number] DOB: 09-Dec-1975  10/13/2021  [name] was observed post Covid-19 immunization for 15 minutes without incident. She was provided with Vaccine Information Sheet and instruction to access the V-Safe system.   [name] was instructed to call 911 with any severe reactions post vaccine: Difficulty breathing  Swelling of face and throat  A fast heartbeat  A bad rash all over body  Dizziness and weakness   Immunizations Administered     Name Date Dose VIS Date Route   Pfizer Covid-19 Vaccine Bivalent Booster 10/13/2021 12:05 PM 0.3 mL 07/28/2021 Intramuscular   Manufacturer: [employer]   Lot: [id number]   [place]: [phone]

## 2022-01-10 DIAGNOSIS — I1310 Hypertensive heart and chronic kidney disease without heart failure, with stage 1 through stage 4 chronic kidney disease, or unspecified chronic kidney disease: Secondary | ICD-10-CM | POA: Diagnosis not present

## 2022-01-10 DIAGNOSIS — H6983 Other specified disorders of Eustachian tube, bilateral: Secondary | ICD-10-CM | POA: Diagnosis not present

## 2022-02-11 DIAGNOSIS — I10 Essential (primary) hypertension: Secondary | ICD-10-CM | POA: Diagnosis not present

## 2022-02-11 DIAGNOSIS — R809 Proteinuria, unspecified: Secondary | ICD-10-CM | POA: Diagnosis not present

## 2022-02-11 DIAGNOSIS — N183 Chronic kidney disease, stage 3 unspecified: Secondary | ICD-10-CM | POA: Diagnosis not present

## 2022-02-25 DIAGNOSIS — I10 Essential (primary) hypertension: Secondary | ICD-10-CM | POA: Diagnosis not present

## 2022-09-16 DIAGNOSIS — J069 Acute upper respiratory infection, unspecified: Secondary | ICD-10-CM | POA: Diagnosis not present

## 2022-10-04 DIAGNOSIS — B379 Candidiasis, unspecified: Secondary | ICD-10-CM | POA: Diagnosis not present

## 2022-10-04 DIAGNOSIS — I10 Essential (primary) hypertension: Secondary | ICD-10-CM | POA: Diagnosis not present

## 2022-10-04 DIAGNOSIS — H66001 Acute suppurative otitis media without spontaneous rupture of ear drum, right ear: Secondary | ICD-10-CM | POA: Diagnosis not present

## 2022-10-04 DIAGNOSIS — J0140 Acute pansinusitis, unspecified: Secondary | ICD-10-CM | POA: Diagnosis not present

## 2023-02-03 DIAGNOSIS — R809 Proteinuria, unspecified: Secondary | ICD-10-CM | POA: Diagnosis not present

## 2023-02-03 DIAGNOSIS — N184 Chronic kidney disease, stage 4 (severe): Secondary | ICD-10-CM | POA: Diagnosis not present

## 2023-02-03 DIAGNOSIS — I1A0 Resistant hypertension: Secondary | ICD-10-CM | POA: Diagnosis not present

## 2023-06-12 DIAGNOSIS — N189 Chronic kidney disease, unspecified: Secondary | ICD-10-CM | POA: Diagnosis not present

## 2023-06-12 DIAGNOSIS — R809 Proteinuria, unspecified: Secondary | ICD-10-CM | POA: Diagnosis not present

## 2023-06-12 DIAGNOSIS — D631 Anemia in chronic kidney disease: Secondary | ICD-10-CM | POA: Diagnosis not present

## 2023-06-12 DIAGNOSIS — I1A0 Resistant hypertension: Secondary | ICD-10-CM | POA: Diagnosis not present

## 2023-06-12 DIAGNOSIS — N2581 Secondary hyperparathyroidism of renal origin: Secondary | ICD-10-CM | POA: Diagnosis not present

## 2023-06-12 DIAGNOSIS — N184 Chronic kidney disease, stage 4 (severe): Secondary | ICD-10-CM | POA: Diagnosis not present

## 2023-10-16 DIAGNOSIS — N184 Chronic kidney disease, stage 4 (severe): Secondary | ICD-10-CM | POA: Diagnosis not present

## 2023-10-16 DIAGNOSIS — R809 Proteinuria, unspecified: Secondary | ICD-10-CM | POA: Diagnosis not present

## 2023-10-16 DIAGNOSIS — I1A0 Resistant hypertension: Secondary | ICD-10-CM | POA: Diagnosis not present

## 2024-01-08 ENCOUNTER — Encounter: Payer: Self-pay | Admitting: Podiatry

## 2024-01-08 ENCOUNTER — Ambulatory Visit (INDEPENDENT_AMBULATORY_CARE_PROVIDER_SITE_OTHER): Payer: BC Managed Care – PPO | Admitting: Podiatry

## 2024-01-08 DIAGNOSIS — L03031 Cellulitis of right toe: Secondary | ICD-10-CM

## 2024-01-08 MED ORDER — CEPHALEXIN 500 MG PO CAPS: 500.0000 mg | ORAL_CAPSULE | Freq: Three times a day (TID) | ORAL | 0 refills | Status: AC

## 2024-01-08 NOTE — Patient Instructions (Signed)

## 2024-01-08 NOTE — Progress Notes (Signed)
 Subjective:   Patient ID: [name], female   DOB: 48 y.o.   MRN: [id number]   HPI Chief Complaint  Patient presents with   Ingrown Toenail    RM#12 np infected ingrown right great toe draining and swollen X1 month no antibiotic treatments.   48 year old female presents with concerns.  No recent injury or treatment.  The toe was tender on the corner.  She said when she hits it will bleed as well.   Review of Systems  All other systems reviewed and are negative.  Past Medical History:  Diagnosis Date   AMA (advanced maternal age) multigravida 35+    Chronic kidney disease    sees kidney clinic   Fibroid    Headache(784.0)    Hypertension     Past Surgical History:  Procedure Laterality Date   CESAREAN SECTION N/A 09/02/2017   Procedure: CESAREAN SECTION;  Surgeon: Zora Hires, MD;  Location: [facility] BIRTHING SUITES;  Service: Obstetrics;  Laterality: N/A;   LEEP     MYOMECTOMY N/A 09/02/2017   Procedure: MYOMECTOMY;  Surgeon: Zora Hires, MD;  Location: [facility] BIRTHING SUITES;  Service: Gynecology;  Laterality: N/A;   NO PAST SURGERIES     WISDOM TOOTH EXTRACTION Bilateral      Current Outpatient Medications:    calcium  carbonate (TUMS - DOSED IN MG ELEMENTAL CALCIUM ) 500 MG chewable tablet, Chew 2 tablets by mouth 3 (three) times daily as needed for indigestion or heartburn., Disp: , Rfl:    cephALEXin  (KEFLEX ) 500 MG capsule, Take 1 capsule (500 mg total) by mouth 3 (three) times daily., Disp: 21 capsule, Rfl: 0   ferrous sulfate  325 (65 FE) MG tablet, Take 325 mg by mouth daily with breakfast., Disp: , Rfl:    ibuprofen  (ADVIL ,MOTRIN ) 600 MG tablet, Take 1 tablet (600 mg total) by mouth every 6 (six) hours as needed for cramping., Disp: 30 tablet, Rfl: 0   labetalol  (NORMODYNE ) 300 MG tablet, Take 1 tablet (300 mg total) by mouth every 8 (eight) hours., Disp: 30 tablet, Rfl: 3   NIFEdipine  (PROCARDIA  XL/ADALAT -CC) 90 MG 24 hr tablet, Take 1 tablet (90 mg total) by  mouth daily., Disp: 30 tablet, Rfl: 3   Prenatal Vit-Fe Fumarate-FA (PRENATAL MULTIVITAMIN) TABS tablet, Take 1 tablet by mouth every evening. , Disp: , Rfl:    COVID-19 mRNA bivalent vaccine, Pfizer, (PFIZER COVID-19 VAC BIVALENT) injection, Inject into the muscle. (Patient not taking: Reported on 01/08/2024), Disp: 0.3 mL, Rfl: 0  Allergies  Allergen Reactions   Shellfish Allergy Hives           Objective:  Physical Exam  General: AAO x3, NAD  Dermatological: Incurvation present along the right lateral hallux nail border with hypergranulation tissue noted.  There is clear drainage noted and some bloody drainage as well.  There is no ascending cellulitis.  There is no open lesions.  The hallux toenail itself is somewhat loose distally.  Vascular: Dorsalis Pedis artery and Posterior Tibial artery pedal pulses are 2/4 bilateral with immedate capillary fill time.  There is no pain with calf compression, swelling, warmth, erythema.   Neruologic: Grossly intact via light touch bilateral.   Musculoskeletal: Tenderness to ingrown toenail.  No other areas of discomfort.     Assessment:   Right lateral hallux ingrown toenail, infection     Plan:  -Treatment options discussed including all alternatives, risks, and complications -Etiology of symptoms were discussed -At this time, recommended partial nail removal without chemical matricectomy  to the lateral due to infection. Risks and complications were discussed with the patient for which they understand and  verbally consent to the procedure. Under sterile conditions a total of 3 mL of a mixture of 2% lidocaine  plain and 0.5% Marcaine  plain was infiltrated in a hallux block fashion. Once anesthetized, the skin was prepped in sterile fashion. A tourniquet was then applied. Next the lateral border of the hallux nail border was sharply excised making sure to remove the entire offending nail border. Once the nail was  Removed, the area was  debrided and the underlying skin was intact. The area was irrigated and hemostasis was obtained.  A dry sterile dressing was applied. After application of the dressing the tourniquet was removed and there is found to be an immediate capillary refill time to the digit. The patient tolerated the procedure well any complications. Post procedure instructions were discussed the patient for which he verbally understood. Follow-up in one week for nail check or sooner if any problems are to arise. Discussed signs/symptoms of worsening infection and directed to call the office immediately should any occur or go directly to the emergency room. In the meantime, encouraged to call the office with any questions, concerns, changes symptoms. -Keflex   Charity Conch DPM

## 2024-04-23 DIAGNOSIS — N1832 Chronic kidney disease, stage 3b: Secondary | ICD-10-CM | POA: Diagnosis not present

## 2024-04-23 DIAGNOSIS — R809 Proteinuria, unspecified: Secondary | ICD-10-CM | POA: Diagnosis not present

## 2024-04-23 DIAGNOSIS — I1A0 Resistant hypertension: Secondary | ICD-10-CM | POA: Diagnosis not present

## 2024-05-06 DIAGNOSIS — I10 Essential (primary) hypertension: Secondary | ICD-10-CM | POA: Diagnosis not present

## 2024-09-26 DIAGNOSIS — Z23 Encounter for immunization: Secondary | ICD-10-CM | POA: Diagnosis not present

## 2024-09-26 DIAGNOSIS — I129 Hypertensive chronic kidney disease with stage 1 through stage 4 chronic kidney disease, or unspecified chronic kidney disease: Secondary | ICD-10-CM | POA: Diagnosis not present

## 2024-09-26 DIAGNOSIS — I1A0 Resistant hypertension: Secondary | ICD-10-CM | POA: Diagnosis not present

## 2024-09-26 DIAGNOSIS — N184 Chronic kidney disease, stage 4 (severe): Secondary | ICD-10-CM | POA: Diagnosis not present
